# Patient Record
Sex: Female | Born: 1940
Health system: Southern US, Community
[De-identification: ages and names within clinical notes are randomized; demographics above are authoritative.]

## PROBLEM LIST (undated history)

## (undated) DIAGNOSIS — Z923 Personal history of irradiation: Secondary | ICD-10-CM

## (undated) DIAGNOSIS — E7800 Pure hypercholesterolemia, unspecified: Secondary | ICD-10-CM

## (undated) DIAGNOSIS — M8580 Other specified disorders of bone density and structure, unspecified site: Secondary | ICD-10-CM

## (undated) DIAGNOSIS — C541 Malignant neoplasm of endometrium: Secondary | ICD-10-CM

## (undated) DIAGNOSIS — C4491 Basal cell carcinoma of skin, unspecified: Secondary | ICD-10-CM

## (undated) HISTORY — DX: Basal cell carcinoma of skin, unspecified: C44.91

## (undated) HISTORY — DX: Personal history of irradiation: Z92.3

## (undated) HISTORY — PX: ABDOMINAL HYSTERECTOMY: SHX81

## (undated) HISTORY — DX: Malignant neoplasm of endometrium: C54.1

## (undated) HISTORY — PX: BASAL CELL CARCINOMA EXCISION: SHX1214

## (undated) HISTORY — DX: Other specified disorders of bone density and structure, unspecified site: M85.80

## (undated) HISTORY — DX: Pure hypercholesterolemia, unspecified: E78.00

## (undated) HISTORY — PX: CATARACT EXTRACTION: SUR2

## (undated) HISTORY — PX: DILATION AND CURETTAGE OF UTERUS: SHX78

---

## 1999-01-06 ENCOUNTER — Other Ambulatory Visit: Admission: RE | Admit: 1999-01-06 | Discharge: 1999-01-06 | Payer: Self-pay | Admitting: Family Medicine

## 1999-01-08 ENCOUNTER — Other Ambulatory Visit: Admission: RE | Admit: 1999-01-08 | Discharge: 1999-01-08 | Payer: Self-pay | Admitting: Gynecology

## 1999-02-06 ENCOUNTER — Encounter (INDEPENDENT_AMBULATORY_CARE_PROVIDER_SITE_OTHER): Payer: Self-pay | Admitting: Specialist

## 1999-02-06 ENCOUNTER — Ambulatory Visit (HOSPITAL_COMMUNITY): Admission: RE | Admit: 1999-02-06 | Discharge: 1999-02-06 | Payer: Self-pay | Admitting: Gynecology

## 2003-05-07 ENCOUNTER — Encounter (INDEPENDENT_AMBULATORY_CARE_PROVIDER_SITE_OTHER): Payer: Self-pay | Admitting: *Deleted

## 2003-05-07 ENCOUNTER — Ambulatory Visit (HOSPITAL_COMMUNITY): Admission: RE | Admit: 2003-05-07 | Discharge: 2003-05-07 | Payer: Self-pay | Admitting: *Deleted

## 2004-02-25 ENCOUNTER — Other Ambulatory Visit: Admission: RE | Admit: 2004-02-25 | Discharge: 2004-02-25 | Payer: Self-pay | Admitting: Family Medicine

## 2005-04-28 ENCOUNTER — Other Ambulatory Visit: Admission: RE | Admit: 2005-04-28 | Discharge: 2005-04-28 | Payer: Self-pay | Admitting: Family Medicine

## 2005-05-15 ENCOUNTER — Encounter: Admission: RE | Admit: 2005-05-15 | Discharge: 2005-08-02 | Payer: Self-pay | Admitting: Family Medicine

## 2006-04-30 ENCOUNTER — Other Ambulatory Visit: Admission: RE | Admit: 2006-04-30 | Discharge: 2006-04-30 | Payer: Self-pay | Admitting: Family Medicine

## 2006-05-18 ENCOUNTER — Encounter: Admission: RE | Admit: 2006-05-18 | Discharge: 2006-05-18 | Payer: Self-pay | Admitting: Family Medicine

## 2009-06-19 ENCOUNTER — Encounter: Admission: RE | Admit: 2009-06-19 | Discharge: 2009-06-19 | Payer: Self-pay | Admitting: Family Medicine

## 2009-08-03 DIAGNOSIS — C541 Malignant neoplasm of endometrium: Secondary | ICD-10-CM

## 2009-08-03 HISTORY — DX: Malignant neoplasm of endometrium: C54.1

## 2009-11-13 ENCOUNTER — Ambulatory Visit: Payer: Self-pay | Admitting: Gynecology

## 2009-11-15 ENCOUNTER — Ambulatory Visit: Payer: Self-pay | Admitting: Gynecology

## 2009-11-19 ENCOUNTER — Ambulatory Visit: Payer: Self-pay | Admitting: Gynecology

## 2009-11-21 ENCOUNTER — Ambulatory Visit
Admission: RE | Admit: 2009-11-21 | Discharge: 2009-11-21 | Payer: Self-pay | Source: Home / Self Care | Admitting: Gynecologic Oncology

## 2009-12-01 HISTORY — PX: OTHER SURGICAL HISTORY: SHX169

## 2009-12-03 ENCOUNTER — Inpatient Hospital Stay (HOSPITAL_COMMUNITY): Admission: RE | Admit: 2009-12-03 | Discharge: 2009-12-06 | Payer: Self-pay | Admitting: Obstetrics & Gynecology

## 2009-12-03 ENCOUNTER — Encounter: Payer: Self-pay | Admitting: Obstetrics & Gynecology

## 2009-12-03 HISTORY — PX: TOTAL ABDOMINAL HYSTERECTOMY W/ BILATERAL SALPINGOOPHORECTOMY: SHX83

## 2009-12-18 ENCOUNTER — Ambulatory Visit: Admission: RE | Admit: 2009-12-18 | Discharge: 2009-12-18 | Payer: Self-pay | Admitting: Gynecologic Oncology

## 2009-12-25 ENCOUNTER — Ambulatory Visit: Admission: RE | Admit: 2009-12-25 | Discharge: 2010-02-23 | Payer: Self-pay | Admitting: Radiation Oncology

## 2010-01-30 ENCOUNTER — Ambulatory Visit: Admission: RE | Admit: 2010-01-30 | Discharge: 2010-01-30 | Payer: Self-pay | Admitting: Gynecologic Oncology

## 2010-01-31 DIAGNOSIS — Z923 Personal history of irradiation: Secondary | ICD-10-CM

## 2010-01-31 HISTORY — DX: Personal history of irradiation: Z92.3

## 2010-05-22 ENCOUNTER — Ambulatory Visit: Admission: RE | Admit: 2010-05-22 | Discharge: 2010-05-22 | Payer: Self-pay | Admitting: Gynecologic Oncology

## 2010-08-20 ENCOUNTER — Other Ambulatory Visit: Payer: Self-pay | Admitting: Dermatology

## 2010-09-25 ENCOUNTER — Ambulatory Visit: Payer: Medicare Other | Attending: Radiation Oncology | Admitting: Radiation Oncology

## 2010-10-21 LAB — BASIC METABOLIC PANEL
CO2: 26 mEq/L (ref 19–32)
Calcium: 7.8 mg/dL — ABNORMAL LOW (ref 8.4–10.5)
Creatinine, Ser: 0.75 mg/dL (ref 0.4–1.2)
Glucose, Bld: 131 mg/dL — ABNORMAL HIGH (ref 70–99)
Sodium: 138 mEq/L (ref 135–145)

## 2010-10-21 LAB — TYPE AND SCREEN
ABO/RH(D): A POS
Antibody Screen: NEGATIVE

## 2010-10-21 LAB — CBC
Hemoglobin: 11.6 g/dL — ABNORMAL LOW (ref 12.0–15.0)
MCHC: 34.1 g/dL (ref 30.0–36.0)
MCHC: 33.8 g/dL (ref 30.0–36.0)
Platelets: 303 10*3/uL (ref 150–400)
RBC: 4.66 MIL/uL (ref 3.87–5.11)
RDW: 12.6 % (ref 11.5–15.5)

## 2010-10-21 LAB — DIFFERENTIAL
Basophils Absolute: 0 10*3/uL (ref 0.0–0.1)
Basophils Relative: 0 % (ref 0–1)
Monocytes Relative: 5 % (ref 3–12)
Neutro Abs: 7.4 10*3/uL (ref 1.7–7.7)
Neutrophils Relative %: 76 % (ref 43–77)

## 2010-10-21 LAB — COMPREHENSIVE METABOLIC PANEL
ALT: 17 U/L (ref 0–35)
AST: 18 U/L (ref 0–37)
Albumin: 3.8 g/dL (ref 3.5–5.2)
Alkaline Phosphatase: 55 U/L (ref 39–117)
GFR calc Af Amer: >60 mL/min (ref >60)
Glucose, Bld: 112 mg/dL — ABNORMAL HIGH (ref 70–99)
Potassium: 4.4 mEq/L (ref 3.5–5.1)
Sodium: 139 mEq/L (ref 135–145)
Total Protein: 6.8 g/dL (ref 6.0–8.3)

## 2010-10-21 LAB — ABO/RH: ABO/RH(D): A POS

## 2010-12-15 ENCOUNTER — Other Ambulatory Visit (HOSPITAL_COMMUNITY)
Admission: RE | Admit: 2010-12-15 | Discharge: 2010-12-15 | Disposition: A | Discharge status: Home / Self Care | Payer: Medicare Other | Source: Ambulatory Visit | Attending: Gynecology | Admitting: Gynecology

## 2010-12-15 ENCOUNTER — Encounter (INDEPENDENT_AMBULATORY_CARE_PROVIDER_SITE_OTHER): Payer: Medicare Other | Admitting: Gynecology

## 2010-12-15 ENCOUNTER — Other Ambulatory Visit: Payer: Self-pay | Admitting: Gynecology

## 2010-12-15 DIAGNOSIS — Z124 Encounter for screening for malignant neoplasm of cervix: Secondary | ICD-10-CM

## 2010-12-15 DIAGNOSIS — N951 Menopausal and female climacteric states: Secondary | ICD-10-CM

## 2010-12-15 DIAGNOSIS — C549 Malignant neoplasm of corpus uteri, unspecified: Secondary | ICD-10-CM

## 2010-12-15 DIAGNOSIS — N952 Postmenopausal atrophic vaginitis: Secondary | ICD-10-CM

## 2011-03-19 ENCOUNTER — Ambulatory Visit: Payer: Medicare Other | Admitting: Gynecologic Oncology

## 2011-03-19 ENCOUNTER — Ambulatory Visit: Payer: Medicare Other | Attending: Gynecologic Oncology | Admitting: Gynecologic Oncology

## 2011-03-19 DIAGNOSIS — Z9079 Acquired absence of other genital organ(s): Secondary | ICD-10-CM | POA: Insufficient documentation

## 2011-03-19 DIAGNOSIS — E785 Hyperlipidemia, unspecified: Secondary | ICD-10-CM | POA: Insufficient documentation

## 2011-03-19 DIAGNOSIS — C549 Malignant neoplasm of corpus uteri, unspecified: Secondary | ICD-10-CM | POA: Insufficient documentation

## 2011-03-19 DIAGNOSIS — M810 Age-related osteoporosis without current pathological fracture: Secondary | ICD-10-CM | POA: Insufficient documentation

## 2011-03-19 DIAGNOSIS — I10 Essential (primary) hypertension: Secondary | ICD-10-CM | POA: Insufficient documentation

## 2011-03-24 NOTE — Consult Note (Signed)
NAMEGUILIANNA, Wyatt NO.:  [id number]  MEDICAL RECORD NO.:  [id number]  LOCATION:  GYN                          FACILITY:  [facility]  PHYSICIAN:  Laurette Schimke, MD     DATE OF BIRTH:  Aug 05, 1940  DATE OF CONSULTATION: DATE OF DISCHARGE:                                CONSULTATION   REASON FOR VISIT:  Surveillance for stage IB grade 2 endometrioid adenocarcinoma of the uterus.  HISTORY OF PRESENT ILLNESS:  70 year old diagnosed with a grade 2 endometrioid adenocarcinoma in early 2011.  On Dec 03, 2009, she underwent a total abdominal hysterectomy, bilateral salpingo- oophorectomy, bilateral pelvic and periaortic lymph node sampling. Pathology was notable for an invasive endometrioid adenocarcinoma grade 2, invading into outer half of the endometrium.  She was a stage IB grade 2.  She underwent adjuvant high-dose rate brachytherapy to a total dose of 50 Gy in 5 fractions, which was completed on February 27, 2010.  PAST MEDICAL HISTORY: 1. Hyperlipidemia. 2. Hypertension. 3. Osteoporosis. 4. Stage IB grade 2 endometrial cancer.  PAST SURGICAL HISTORY: 1. Total abdominal hysterectomy. 2. Bilateral salpingo-oophorectomy. 3. Bilateral pelvic and periaortic lymph node dissection in May 2011. 4. Bilateral blepharoplasty in May 2011.  FAMILY HISTORY:  Two premenopausal daughters with a diagnosis of breast cancer and a half-sister diagnosed with breast cancer.  The patient and her daughters have declined genetic evaluation.  SCREENING HISTORY:  Colonoscopy in 2009, within normal limits.  Breast evaluation this year.  SOCIAL HISTORY:  Victoria Wyatt is widowed, lives independently, has a history of tobacco  use, but no current use.  Has daughters who were very supportive.  REVIEW OF SYSTEMS:  No headache, shortness of breath, chest pain, adenopathy, bloating.  No constipation.  History of diarrhea a week ago that resolved.  Denies lower or upper extremity edema.  No  change in appetite, early satiety.  No vaginal bleeding.  No hematuria or hematochezia.  Otherwise, 10-point review of systems is negative.  PHYSICAL EXAMINATION:  GENERAL:  Well-developed thin female.  No acute distress. VITAL SIGNS:  Weight 127 pounds, blood pressure 132/72, temperature 97.8. CHEST:  Clear to auscultation. HEART:  Regular rate and rhythm. ABDOMEN:  Soft, nontender, nondistended.  No palpable masses. BACK:  No CVA tenderness. EXTREMITIES:  No clubbing, cyanosis, or edema. PELVIC:  Normal external genitalia, Bartholin, urethral and Skene. Atrophic appearing vagina without any discharge.  No masses palpable in the cul-de-sac, pelvis, or rectovaginal septum. RECTAL:  Good anal sphincter tone without any masses.  IMPRESSION:  Victoria Wyatt has no evidence of disease from a stage IB grade 2 endometrioid adenocarcinoma, treated with staging and adjuvant brachytherapy.  The followup plan is that she will see GYN-Oncology in November.  She will see Dr. Lurline Hare of Radiation Oncology in January of 2013, as has been on radiation scheduled and to follow up with Dr. Colin Broach in May 2013.     Laurette Schimke, MD     WB/MEDQ  D:  03/19/2011  T:  03/19/2011  Job:  [id number]  cc:   Marcial Pacas P. Fontaine, M.D. Fax: [phone]  Lurline Hare, M.D. Fax: [phone]  Telford Nab, R.N. [address] [place], [place] [address]  Anna Genre. Little, M.D. Fax: [phone]  Electronically Signed by Laurette Schimke MD on 03/24/2011 07:03:47 AM

## 2011-03-25 ENCOUNTER — Other Ambulatory Visit: Payer: Self-pay | Admitting: Dermatology

## 2011-06-10 NOTE — Progress Notes (Signed)
Consult Note: Gyn-Onc  Victoria Wyatt 70 y.o. female  CC:  Chief Complaint  Patient presents with  . Follow-up    Endo ca    HPI:   70 year old diagnosed with a grade 2 endometrioid adenocarcinoma in early 2011.  On Dec 03, 2009, she underwent a total abdominal hysterectomy, bilateral salpingo-oophorectomy, bilateral pelvic and periaortic lymph node sampling. Pathology was notable for an invasive endometrioid adenocarcinoma grade 2, invading into outer half of the endometrium.  She was a stage IB grade 2.  She underwent adjuvant high-dose rate brachytherapy to a total dose of 50 Gy in 5 fractions, which was completed on February 27, 2010.    Interval History: She denies no interval complaints. A Pap test was collected by Dr. Audie Box and was within normal limits.  Performance status 0  Review of Systems  Constitutional  Feels well no complaints Skin/Breast  No rash, sores, jaundice, itching, dryness, lumps, swelling, breast pain, or nipple discharge. Age spots. Continues to require removal of multiple precancerous lesion.  Cardiovascular  No chest pain, shortness of breath, or edema  Pulmonary  No cough or wheeze.  Gastro Intestinal  No nausea, vomitting, or diarrhoea. No bright red blood per rectum, no abdominal pain, change in bowel movement, or constipation.  Genito Urinary  No frequency, urgency, dysuria, no vaginal bleeding continues to use her dilator 3 times per week. Musculo Skeletal  No myalgia, arthralgia, joint swelling or pain  Neurologic  No weakness, numbness, change in gait,  Psychology  No depression, anxiety, insomnia.    Current Meds:  Outpatient Encounter Prescriptions as of 07/02/2011  Medication Sig Dispense Refill  . aspirin 81 MG chewable tablet Chew 81 mg by mouth daily.        . calcium-vitamin D (OSCAL WITH D) 500-200 MG-UNIT per tablet Take 1 tablet by mouth. Take 2 daily       . hydrochlorothiazide (,MICROZIDE/HYDRODIURIL,) 12.5 MG capsule Take  12.5 mg by mouth daily.        . Multiple Vitamins-Minerals (CENTRUM SILVER PO) Take by mouth daily.        . pravastatin (PRAVACHOL) 20 MG tablet Take 20 mg by mouth daily.          Allergy:  Allergies  Allergen Reactions  . Lidocaine     Causes facial swelling and rednes    Social Hx:   History   Social History  . Marital Status: Widowed    Spouse Name: N/A    Number of Children: N/A  . Years of Education: N/A   Occupational History  . Not on file.   Social History Main Topics  . Smoking status: Former Games developer  . Smokeless tobacco: Not on file  . Alcohol Use: 3.0 oz/week    5 Glasses of wine per week  . Drug Use: No  . Sexually Active: No   Other Topics Concern  . Not on file   Social History Narrative  . No narrative on file    Past Surgical Hx:  Past Surgical History  Procedure Date  . Total abdominal hysterectomy w/ bilateral salpingoophorectomy 2011  . Cataract extraction     bi-lat  . Dilation and curettage of uterus   . Bilateral blepharoplasty May 2011    Past Medical Hx:  Past Medical History  Diagnosis Date  . Hypercholesteremia   . Hypertension   . Bone loss   . Cancer   . Malignant neoplasm of body of uterus 07/02/2011    Family Hx:  Family History  Problem Relation Age of Onset  . Cancer Sister     breast  . Breast cancer Daughter   . Cancer Daughter   . Emphysema Mother   . Hypertension Father   . Emphysema Father     Vitals:  Blood pressure 132/70, pulse 64, temperature 98.1 F (36.7 C), temperature source Oral, resp. rate 16, height 5' 4.37" (1.635 m), weight 127 lb 14.4 oz (58.015 kg).  Physical Exam: Neck  Supple without any enlargements.  Cardiovascular  Pulse normal rate, regularity and rhythm. S1 and S2 normal, without any murmur, rub, or gallop.  Lungs  Clear to auscultation bilateraly, without wheezes/crackles/rhonchi. Good air movement.  Skin  No rash/lesions/breakdown  Psychiatry  Alert and oriented to  person, place, and time  Abdomen  Normoactive bowel sounds, abdomen soft, non-tender and obese. Surgical  sites intact without evidence of hernia. Hyperactive bowel sounds no masses no ascites identified. Genito Urinary  Vulva/vagina: Normal external female genitalia.  No lesions. No discharge or bleeding. Vagina is atrophic without any palpable masses  Bladder/urethra:  No lesions or masses  No cul-de-sac masses  Rectal  Good tone, no masses no cul de sac nodularity.  Extremities  No bilateral cyanosis, clubbing or edema. No rash, lesions or petiche.    Assessment/Plan: Victoria Wyatt has no evidence of disease at this visit from her stage IB grade 2 endometrioid adenocarcinoma she was counseled regarding signs and symptoms of recurrent disease. Victoria Wyatt has been advised to followup with Dr. Lurline Hare in March of 2013. Followup with Dr. Audie Box and annual Pap evaluation in July of 2013. She'll follow up with the GYN oncology service in July of 2013.   Laurette Schimke, MD 07/02/2011, 9:16 AM

## 2011-06-19 ENCOUNTER — Other Ambulatory Visit: Payer: Self-pay | Admitting: Dermatology

## 2011-07-01 ENCOUNTER — Encounter: Payer: Self-pay | Admitting: *Deleted

## 2011-07-02 ENCOUNTER — Ambulatory Visit: Payer: Medicare Other | Attending: Gynecologic Oncology | Admitting: Gynecologic Oncology

## 2011-07-02 ENCOUNTER — Encounter: Payer: Self-pay | Admitting: Gynecologic Oncology

## 2011-07-02 DIAGNOSIS — Z7982 Long term (current) use of aspirin: Secondary | ICD-10-CM | POA: Insufficient documentation

## 2011-07-02 DIAGNOSIS — Z79899 Other long term (current) drug therapy: Secondary | ICD-10-CM | POA: Insufficient documentation

## 2011-07-02 DIAGNOSIS — I10 Essential (primary) hypertension: Secondary | ICD-10-CM | POA: Insufficient documentation

## 2011-07-02 DIAGNOSIS — C549 Malignant neoplasm of corpus uteri, unspecified: Secondary | ICD-10-CM | POA: Insufficient documentation

## 2011-07-02 DIAGNOSIS — E7800 Pure hypercholesterolemia, unspecified: Secondary | ICD-10-CM | POA: Insufficient documentation

## 2011-07-02 NOTE — Patient Instructions (Addendum)
Assessment/Plan:  No evidence of disease at this visit from her stage IB grade 2 endometrioid adenocarcinoma.  Advised to followup with Dr. Lurline Hare in March of 2013. Followup with Dr. Audie Box and annual Pap evaluation in July of 2013. She'll follow up with the GYN oncology service in July of 2013.

## 2011-07-06 ENCOUNTER — Encounter: Payer: Self-pay | Admitting: Gynecology

## 2011-08-26 ENCOUNTER — Other Ambulatory Visit: Payer: Self-pay | Admitting: Dermatology

## 2011-08-26 DIAGNOSIS — C44519 Basal cell carcinoma of skin of other part of trunk: Secondary | ICD-10-CM | POA: Diagnosis not present

## 2011-08-26 DIAGNOSIS — C44711 Basal cell carcinoma of skin of unspecified lower limb, including hip: Secondary | ICD-10-CM | POA: Diagnosis not present

## 2011-08-26 DIAGNOSIS — L821 Other seborrheic keratosis: Secondary | ICD-10-CM | POA: Diagnosis not present

## 2011-08-26 DIAGNOSIS — D239 Other benign neoplasm of skin, unspecified: Secondary | ICD-10-CM | POA: Diagnosis not present

## 2011-08-27 ENCOUNTER — Ambulatory Visit: Payer: Medicare Other | Admitting: Radiation Oncology

## 2011-09-30 DIAGNOSIS — R7301 Impaired fasting glucose: Secondary | ICD-10-CM | POA: Diagnosis not present

## 2011-10-15 ENCOUNTER — Encounter: Payer: Self-pay | Admitting: Radiation Oncology

## 2011-10-15 ENCOUNTER — Ambulatory Visit
Admission: RE | Admit: 2011-10-15 | Discharge: 2011-10-15 | Disposition: A | Discharge status: Home / Self Care | Payer: Medicare Other | Source: Ambulatory Visit | Attending: Radiation Oncology | Admitting: Radiation Oncology

## 2011-10-15 NOTE — Progress Notes (Signed)
CC:   Victoria Schimke, MD  DIAGNOSIS:  Stage IB, grade 2 endometrioid adenocarcinoma of the uterus.  PREVIOUS RADIATION:  HDR intracavitary brachytherapy to a total dose of 30 Gy in 5 fractions completed 01/2010.  INTERVAL SINCE TREATMENT:  1 year and half.  INTERVAL HISTORY:  Victoria Wyatt reports for followup today.  She has unfortunately had a recurrence of her diarrhea and is taking probiotics for that.  She has struggled with that since taking Cipro after eye surgery.  She had a negative Pap smear in May.  She is seeing Dr. Nelly Rout in October.  She is using her dilator regularly.  PHYSICAL EXAMINATION:  General:  She is a pleasant female in no distress, sitting comfortably on the exam room table.  Vital signs: Weight 128 pounds.  Blood pressure 161/69, pulse 66, temperature 97. She has no palpable supraclavicular adenopathy.  She is alert and orient x3.  Examination of the vulva and introitus show no mucosal lesions. Her vaginal cuff is smooth with postoperative changes.  There is no evidence of recurrence at the vaginal cuff.  IMPRESSION:  Stage IB, grade 2 endometrial cancer with no evidence of disease.  RECOMMENDATIONS:  Victoria Wyatt looks great.  She will continue followup with myself, Dr. Nelly Rout and her primary care physician.  She will get her normal Pap smear this summer.  I have encouraged her to contact me with any questions or concerns.    ______________________________ Lurline Hare, M.D. SW/MEDQ  D:  10/15/2011  T:  10/15/2011  Job:  [id number]

## 2011-10-15 NOTE — Progress Notes (Signed)
HERE TODAY FOR FU UTERINE CANCER .  IS TAKING PROBIOTIC FOR DIARRHEA THAT COMES AND GOES, OK'D BY HER PCP.  NO OTHER C/O

## 2011-11-03 DIAGNOSIS — M810 Age-related osteoporosis without current pathological fracture: Secondary | ICD-10-CM | POA: Diagnosis not present

## 2011-11-03 DIAGNOSIS — R7301 Impaired fasting glucose: Secondary | ICD-10-CM | POA: Diagnosis not present

## 2011-11-03 DIAGNOSIS — I10 Essential (primary) hypertension: Secondary | ICD-10-CM | POA: Diagnosis not present

## 2011-11-03 DIAGNOSIS — E7800 Pure hypercholesterolemia, unspecified: Secondary | ICD-10-CM | POA: Diagnosis not present

## 2011-11-03 DIAGNOSIS — M25569 Pain in unspecified knee: Secondary | ICD-10-CM | POA: Diagnosis not present

## 2011-11-17 DIAGNOSIS — M7120 Synovial cyst of popliteal space [Baker], unspecified knee: Secondary | ICD-10-CM | POA: Diagnosis not present

## 2011-12-17 DIAGNOSIS — R319 Hematuria, unspecified: Secondary | ICD-10-CM | POA: Diagnosis not present

## 2011-12-17 DIAGNOSIS — N390 Urinary tract infection, site not specified: Secondary | ICD-10-CM | POA: Diagnosis not present

## 2011-12-25 DIAGNOSIS — L723 Sebaceous cyst: Secondary | ICD-10-CM | POA: Diagnosis not present

## 2011-12-25 DIAGNOSIS — Z85828 Personal history of other malignant neoplasm of skin: Secondary | ICD-10-CM | POA: Diagnosis not present

## 2011-12-25 DIAGNOSIS — I6789 Other cerebrovascular disease: Secondary | ICD-10-CM | POA: Diagnosis not present

## 2012-01-08 ENCOUNTER — Encounter: Payer: Self-pay | Admitting: Gynecology

## 2012-01-08 ENCOUNTER — Other Ambulatory Visit (HOSPITAL_COMMUNITY)
Admission: RE | Admit: 2012-01-08 | Discharge: 2012-01-08 | Disposition: A | Discharge status: Home / Self Care | Payer: Medicare Other | Source: Ambulatory Visit | Attending: Gynecology | Admitting: Gynecology

## 2012-01-08 ENCOUNTER — Ambulatory Visit (INDEPENDENT_AMBULATORY_CARE_PROVIDER_SITE_OTHER): Payer: Medicare Other | Admitting: Gynecology

## 2012-01-08 VITALS — BP 116/72 | Ht 65.0 in | Wt 128.0 lb

## 2012-01-08 DIAGNOSIS — N952 Postmenopausal atrophic vaginitis: Secondary | ICD-10-CM | POA: Diagnosis not present

## 2012-01-08 DIAGNOSIS — Z9189 Other specified personal risk factors, not elsewhere classified: Secondary | ICD-10-CM | POA: Diagnosis not present

## 2012-01-08 DIAGNOSIS — Z124 Encounter for screening for malignant neoplasm of cervix: Secondary | ICD-10-CM | POA: Insufficient documentation

## 2012-01-08 DIAGNOSIS — C4491 Basal cell carcinoma of skin, unspecified: Secondary | ICD-10-CM | POA: Insufficient documentation

## 2012-01-08 DIAGNOSIS — R319 Hematuria, unspecified: Secondary | ICD-10-CM

## 2012-01-08 DIAGNOSIS — C55 Malignant neoplasm of uterus, part unspecified: Secondary | ICD-10-CM | POA: Diagnosis not present

## 2012-01-08 LAB — URINALYSIS W MICROSCOPIC + REFLEX CULTURE
Bilirubin Urine: NEGATIVE
Glucose, UA: NEGATIVE mg/dL
Hgb urine dipstick: NEGATIVE
Protein, ur: NEGATIVE mg/dL
pH: 6 (ref 5.0–8.0)

## 2012-01-08 NOTE — Progress Notes (Signed)
Victoria Wyatt [birth year] [id number]        71 y.o.  for follow up exam. Several issues noted below.  Past medical history,surgical history, medications, allergies, family history and social history were all reviewed and documented in the EPIC chart. ROS:  Was performed and pertinent positives and negatives are included in the history.  Exam: Victoria Wyatt chaperone present Filed Vitals:   01/08/12 0912  BP: 116/72   General appearance  Normal Skin multiple benign-appearing seborrheic keratoses covering chest and back Head/Neck normal with no cervical or supraclavicular adenopathy thyroid normal Lungs  clear Cardiac RR, without RMG Abdominal  soft, nontender, without masses, organomegaly or hernia Breasts  examined lying and sitting without masses, retractions, discharge or axillary adenopathy. Pelvic  Ext/BUS/vagina  Atrophic changes. Pap of cuff done  Adnexa  Without masses or tenderness    Anus and perineum  normal   Rectovaginal  normal sphincter tone without palpated masses or tenderness.    Assessment/Plan:  71 y.o. female for follow up.    1. Hematuria. Patient has a history of single episode of hematuria. She saw her primary physician 12/17/2011 who did a urinalysis which showed large leukocyte esterase large blood on dipstick, microscopic showed occasional rbc's too numerous to count WBCs. She was treated as a UTI and has never had another episode of hematuria. She was getting a little bit of irritation before this which has resolved.  She's had no recurrence of this. She is status post TAH/BSO radiation treatment for stage IB grade 2 endometrial carcinoma 2011.  Exam today is normal except for atrophic changes. Urinalysis today is negative without evidence of blood or any other or abnormality. Reviewed with the patient that I think this is probably related to infection and that we could monitor, if she had any recurrent episodes to refer her to urology, but given her history of endometrial  carcinoma I'm going to check with Dr. Laurette Wyatt who is her gynecologic oncologist for her opinion as to whether to send her now or monitor. 2. Stage IB, grade 2 endometrial carcinoma. Exam is normal. Pap of cuff done. We'll follow up with Dr. Nelly Wyatt in 6 months as we are alternating exams. 3. Mammography. Patient due for mammogram next week and will follow up for this. She'll continue with annual mammography. SBE monthly reviewed. 4. Osteopenia. Patient has osteopenia on DEXA. She's due for repeat November 2013 and noticed follow up for this. She's been followed by Dr. Clarene Wyatt for this. 5. Colonoscopy. Patient had her colonoscopy in 2009, repeated the recommended interval. 6. Health maintenance. Her blood work was done today as is all done through Dr. Fredirick Wyatt office.     Victoria Lords MD, 10:04 AM 01/08/2012

## 2012-01-08 NOTE — Patient Instructions (Signed)
Office will contact you with Dr. Forrestine Him recommendation. Follow up in 6 months with Dr. Nelly Rout for follow up

## 2012-01-13 ENCOUNTER — Telehealth: Payer: Self-pay | Admitting: Gynecology

## 2012-01-13 NOTE — Telephone Encounter (Signed)
Tell patient that Dr. Nelly Rout answered my e-mail and that she felt we could watch for now but if she has a recurrence of hematuria then she would recommend urology evaluation.

## 2012-01-13 NOTE — Telephone Encounter (Signed)
Pt informed with the below note. 

## 2012-01-13 NOTE — Telephone Encounter (Signed)
Left message for pt to call.

## 2012-04-15 DIAGNOSIS — M810 Age-related osteoporosis without current pathological fracture: Secondary | ICD-10-CM | POA: Diagnosis not present

## 2012-04-15 DIAGNOSIS — R319 Hematuria, unspecified: Secondary | ICD-10-CM | POA: Diagnosis not present

## 2012-04-15 DIAGNOSIS — N390 Urinary tract infection, site not specified: Secondary | ICD-10-CM | POA: Diagnosis not present

## 2012-04-15 DIAGNOSIS — R7301 Impaired fasting glucose: Secondary | ICD-10-CM | POA: Diagnosis not present

## 2012-04-15 DIAGNOSIS — M25569 Pain in unspecified knee: Secondary | ICD-10-CM | POA: Diagnosis not present

## 2012-04-15 DIAGNOSIS — Z23 Encounter for immunization: Secondary | ICD-10-CM | POA: Diagnosis not present

## 2012-04-15 DIAGNOSIS — I10 Essential (primary) hypertension: Secondary | ICD-10-CM | POA: Diagnosis not present

## 2012-04-15 DIAGNOSIS — E7800 Pure hypercholesterolemia, unspecified: Secondary | ICD-10-CM | POA: Diagnosis not present

## 2012-06-09 ENCOUNTER — Encounter: Payer: Self-pay | Admitting: Gynecologic Oncology

## 2012-06-09 ENCOUNTER — Ambulatory Visit: Payer: Medicare Other | Attending: Gynecologic Oncology | Admitting: Gynecologic Oncology

## 2012-06-09 VITALS — BP 162/70 | HR 72 | Temp 98.5°F | Resp 16 | Ht 64.37 in | Wt 130.2 lb

## 2012-06-09 DIAGNOSIS — Z90710 Acquired absence of both cervix and uterus: Secondary | ICD-10-CM | POA: Insufficient documentation

## 2012-06-09 DIAGNOSIS — Z9079 Acquired absence of other genital organ(s): Secondary | ICD-10-CM | POA: Insufficient documentation

## 2012-06-09 DIAGNOSIS — C549 Malignant neoplasm of corpus uteri, unspecified: Secondary | ICD-10-CM | POA: Diagnosis not present

## 2012-06-09 DIAGNOSIS — I10 Essential (primary) hypertension: Secondary | ICD-10-CM | POA: Diagnosis not present

## 2012-06-09 DIAGNOSIS — Z8542 Personal history of malignant neoplasm of other parts of uterus: Secondary | ICD-10-CM | POA: Insufficient documentation

## 2012-06-09 DIAGNOSIS — Z9221 Personal history of antineoplastic chemotherapy: Secondary | ICD-10-CM | POA: Insufficient documentation

## 2012-06-09 DIAGNOSIS — Z85828 Personal history of other malignant neoplasm of skin: Secondary | ICD-10-CM | POA: Insufficient documentation

## 2012-06-09 DIAGNOSIS — Z803 Family history of malignant neoplasm of breast: Secondary | ICD-10-CM | POA: Diagnosis not present

## 2012-06-09 DIAGNOSIS — Z809 Family history of malignant neoplasm, unspecified: Secondary | ICD-10-CM | POA: Insufficient documentation

## 2012-06-09 DIAGNOSIS — Z8249 Family history of ischemic heart disease and other diseases of the circulatory system: Secondary | ICD-10-CM | POA: Insufficient documentation

## 2012-06-09 DIAGNOSIS — N390 Urinary tract infection, site not specified: Secondary | ICD-10-CM | POA: Insufficient documentation

## 2012-06-09 DIAGNOSIS — C541 Malignant neoplasm of endometrium: Secondary | ICD-10-CM

## 2012-06-09 NOTE — Patient Instructions (Signed)
If there any further episodes of hematuria that are not associated with concurrent  urinary tract infection and evaluation by urology would be prudent given the recent history of radiotherapy. No evidence of disease since completion of treatment on 02/27/2010  Followup with Dr. Audie Box in 6 months  Followup with GYN oncology in 6 months   Thank you very much Ms. [name] for allowing me to provide care for you today.  I appreciate your confidence in choosing our Gynecologic Oncology team.  If you have any questions about your visit today please call our office and we will get back to you as soon as possible.  Maryclare Labrador. Maxey Ransom MD., PhD Gynecologic Oncology

## 2012-06-09 NOTE — Progress Notes (Signed)
Consult Note: Gyn-Onc  Victoria Wyatt 71 y.o. female  CC:  Chief Complaint  Patient presents with  . Endo ca    Follow up    HPI:   71 year old diagnosed with a grade 2 endometrioid adenocarcinoma in early 2011.  On Dec 03, 2009, she underwent a total abdominal hysterectomy, bilateral salpingo-oophorectomy, bilateral pelvic and periaortic lymph node sampling. Pathology was notable for an invasive endometrioid adenocarcinoma grade 2, invading into outer half of the endometrium.  She was a stage IB grade 2.  She underwent adjuvant high-dose rate brachytherapy to a total dose of 50 Gy in 5 fractions, which was completed on February 27, 2010.    Interval History:   In May 2013 hematuria was noted it lasted approximately 3 days, self limiting. Followup urinalysis was negative for hematuria. Patient denies any further occurrences. She denies nausea vomiting, changes in weight early satiety back pain headache cough bleeding from the rectum or the vagina.  Performance status 0  Review of Systems  Constitutional  Feels well no complaints Cardiovascular  No chest pain, shortness of breath, or edema  Pulmonary  No cough or wheeze. Reports sinus drip Gastro Intestinal  No nausea, vomitting, or diarrhoea. No bright red blood per rectum, no abdominal pain, change in bowel movement, or constipation.  Genito Urinary  No frequency, urgency, dysuria, no vaginal bleeding. Pap test this year was within normal limits Musculo Skeletal  No myalgia, joint swelling or pain .  Reports significant arthritis. Neurologic  No weakness, numbness, change in gait,  Psychology  No depression, anxiety, insomnia.   Social Hx:  No interval changes.  Very happy.  Past Surgical Hx:  Past Surgical History  Procedure Date  . Total abdominal hysterectomy w/ bilateral salpingoophorectomy 2011  . Cataract extraction     bi-lat  . Dilation and curettage of uterus   . Bilateral blepharoplasty May 2011    Past  Medical Hx:  Past Medical History  Diagnosis Date  . Hypercholesteremia   . Hypertension   . Fibroid   . Cancer   . Malignant neoplasm of body of uterus 07/02/2011    Stage IB, grade 2  . Basal cell cancer   . Osteopenia 06/2010    T score -1.5    Family Hx:  Family History  Problem Relation Age of Onset  . Breast cancer Sister   . Heart disease Sister   . Breast cancer Daughter   . Cancer Daughter   . Emphysema Mother   . Hypertension Father   . Emphysema Father   . Breast cancer Daughter     Vitals:  Blood pressure 162/70, pulse 72, temperature 98.5 F (36.9 C), temperature source Oral, resp. rate 16, height 5' 4.37" (1.635 m), weight 130 lb 3.2 oz (59.058 kg).  Physical Exam: Neck  Supple without any enlargements.  Cardiovascular  Pulse normal rate, regularity and rhythm. S1 and S2 normal, without any murmur, rub, or gallop.  Lungs  Clear to auscultation bilateraly, without wheezes/crackles/rhonchi. Good air movement.  Psychiatry  Alert and oriented to person, place, and time  Abdomen  Normoactive bowel sounds, abdomen soft, non-tender.  Midline surgical incision notable for approximately 1.5 cm hernia at the superior aspect of the incision. Normal bowel sounds no masses no ascites identified. Genito Urinary  Vulva/vagina: Normal external female genitalia.  No lesions. No discharge or bleeding. Vagina is atrophic without any palpable masses  Bladder/urethra:  No lesions or masses  No cul-de-sac masses  Rectal  Good  tone, no masses no cul de sac nodularity.  Extremities  No bilateral cyanosis, clubbing or edema. No rash, lesions or petiche.    Assessment/Plan: Ms. Victoria Wyatt has no evidence of disease at this visit from her stage IB grade 2 endometrioid adenocarcinoma Ms. Victoria Wyatt has been advised that if there any further episodes of hematuria that are not associated with concurrent  Urinary tract infection and evaluation by urology would be prudent given the recent  history of radiotherapy. No evidence of disease since completion of treatment on 02/27/2010  Followup with Dr. Audie Box in 6 months  Followup with GYN oncology in 6 months  Laurette Schimke, MD 06/09/2012, 8:32 AM

## 2012-07-05 ENCOUNTER — Encounter: Payer: Self-pay | Admitting: Gynecology

## 2012-07-05 DIAGNOSIS — Z1231 Encounter for screening mammogram for malignant neoplasm of breast: Secondary | ICD-10-CM | POA: Diagnosis not present

## 2012-07-05 DIAGNOSIS — M899 Disorder of bone, unspecified: Secondary | ICD-10-CM | POA: Diagnosis not present

## 2012-07-05 DIAGNOSIS — M949 Disorder of cartilage, unspecified: Secondary | ICD-10-CM | POA: Diagnosis not present

## 2012-07-06 ENCOUNTER — Encounter: Payer: Self-pay | Admitting: Gynecology

## 2012-08-26 ENCOUNTER — Other Ambulatory Visit: Payer: Self-pay | Admitting: Dermatology

## 2012-08-26 DIAGNOSIS — D485 Neoplasm of uncertain behavior of skin: Secondary | ICD-10-CM | POA: Diagnosis not present

## 2012-08-26 DIAGNOSIS — D239 Other benign neoplasm of skin, unspecified: Secondary | ICD-10-CM | POA: Diagnosis not present

## 2012-08-26 DIAGNOSIS — D1739 Benign lipomatous neoplasm of skin and subcutaneous tissue of other sites: Secondary | ICD-10-CM | POA: Diagnosis not present

## 2012-08-26 DIAGNOSIS — L820 Inflamed seborrheic keratosis: Secondary | ICD-10-CM | POA: Diagnosis not present

## 2012-08-26 DIAGNOSIS — D1801 Hemangioma of skin and subcutaneous tissue: Secondary | ICD-10-CM | POA: Diagnosis not present

## 2012-08-26 DIAGNOSIS — L821 Other seborrheic keratosis: Secondary | ICD-10-CM | POA: Diagnosis not present

## 2012-10-13 ENCOUNTER — Ambulatory Visit: Payer: Medicare Other | Admitting: Radiation Oncology

## 2012-10-14 ENCOUNTER — Ambulatory Visit: Payer: Medicare Other | Admitting: Radiation Oncology

## 2012-10-19 ENCOUNTER — Encounter: Payer: Self-pay | Admitting: Radiation Oncology

## 2012-10-19 DIAGNOSIS — C541 Malignant neoplasm of endometrium: Secondary | ICD-10-CM | POA: Insufficient documentation

## 2012-10-19 DIAGNOSIS — Z923 Personal history of irradiation: Secondary | ICD-10-CM

## 2012-10-21 ENCOUNTER — Ambulatory Visit
Admission: RE | Admit: 2012-10-21 | Discharge: 2012-10-21 | Disposition: A | Discharge status: Home / Self Care | Payer: Medicare Other | Source: Ambulatory Visit | Attending: Radiation Oncology | Admitting: Radiation Oncology

## 2012-10-21 ENCOUNTER — Encounter: Payer: Self-pay | Admitting: Radiation Oncology

## 2012-10-21 VITALS — BP 194/75 | HR 77 | Temp 98.2°F | Resp 20 | Wt 133.4 lb

## 2012-10-21 DIAGNOSIS — C541 Malignant neoplasm of endometrium: Secondary | ICD-10-CM

## 2012-10-21 NOTE — Progress Notes (Signed)
Pt denies pain, fatigue, loss of appetite, nausea, diarrhea, vaginal discharge. She is using dilator 3 days a week. Last pap smear June 2013.

## 2012-10-21 NOTE — Progress Notes (Signed)
   Department of Radiation Oncology  Phone:  [phone] Fax:        [phone]   Name: Victoria Wyatt MRN: [id number]  DOB: 1941-02-04  Date: 10/21/2012  Follow Up Visit Note  Diagnosis: Stage IB grade 2 endometrioid adenocarcinoma of the uterus  Summary and Interval since last radiation: HDR intracavitary brachytherapy to a total dose of 30 gray in 5 fractions completed 7 2011  Interval History: Hadlei presents today for routine followup.  She is almost 3 years out from treatment. She had an episode of hematuria. This occurred during the summer and was worked up by Dr. Nelly Rout and her GYN. She has no pelvic related complaints. She is using her dilator 3 times a week. She is traveling back and forth to the beach. She's not noticed any uterine bleeding. No abdominal pain or tenderness. She had a negative Pap smear this summer.  Allergies:  Allergies  Allergen Reactions  . Lidocaine     Causes facial swelling and redness, Lidocaine was mixed w/Epinephrine per pt    Medications:  Current Outpatient Prescriptions  Medication Sig Dispense Refill  . aspirin 81 MG chewable tablet Chew 81 mg by mouth daily.        . calcium-vitamin D (OSCAL WITH D) 500-200 MG-UNIT per tablet Take 1 tablet by mouth. Take 2 daily       . hydrochlorothiazide (,MICROZIDE/HYDRODIURIL,) 12.5 MG capsule Take 12.5 mg by mouth daily.        . Multiple Vitamins-Minerals (CENTRUM SILVER PO) Take by mouth daily.        . pravastatin (PRAVACHOL) 20 MG tablet Take 20 mg by mouth daily.        . Probiotic Product (ALIGN) 4 MG CAPS Take 1 capsule by mouth daily.       No current facility-administered medications for this encounter.    Physical Exam:  Filed Vitals:   10/21/12 1255  BP: 194/75  Pulse: 77  Temp: 98.2 F (36.8 C)  Resp: 20   she is pleasant female in no distress sitting comfortably examining table. She has external hemorrhoids. She has normal female genitalia. Examination of the vaginal cuff  reveals no evidence of disease recurrence. She has a sebaceous cyst in the left groin.  IMPRESSION: Sundeep is a 72 y.o. female status post hysterectomy and HDR brachytherapy with no evidence of disease recurrence  PLAN:  Sina looks great. Her NCCN guidelines she can released every six-month followup. She has a scheduled with her GYN and Dr. Nelly Rout. I have released her from followup with me. I be happy to see her back on a when necessary basis. She knows she can always contact me with any questions or concerns.    Lurline Hare, MD

## 2012-10-24 DIAGNOSIS — Z1331 Encounter for screening for depression: Secondary | ICD-10-CM | POA: Diagnosis not present

## 2012-10-24 DIAGNOSIS — C549 Malignant neoplasm of corpus uteri, unspecified: Secondary | ICD-10-CM | POA: Diagnosis not present

## 2012-10-24 DIAGNOSIS — I10 Essential (primary) hypertension: Secondary | ICD-10-CM | POA: Diagnosis not present

## 2012-10-24 DIAGNOSIS — R7301 Impaired fasting glucose: Secondary | ICD-10-CM | POA: Diagnosis not present

## 2012-10-24 DIAGNOSIS — M810 Age-related osteoporosis without current pathological fracture: Secondary | ICD-10-CM | POA: Diagnosis not present

## 2012-12-21 DIAGNOSIS — H5000 Unspecified esotropia: Secondary | ICD-10-CM | POA: Diagnosis not present

## 2012-12-21 DIAGNOSIS — H53039 Strabismic amblyopia, unspecified eye: Secondary | ICD-10-CM | POA: Diagnosis not present

## 2012-12-21 DIAGNOSIS — Z961 Presence of intraocular lens: Secondary | ICD-10-CM | POA: Diagnosis not present

## 2012-12-27 DIAGNOSIS — M25579 Pain in unspecified ankle and joints of unspecified foot: Secondary | ICD-10-CM | POA: Diagnosis not present

## 2013-01-23 ENCOUNTER — Other Ambulatory Visit (HOSPITAL_COMMUNITY)
Admission: RE | Admit: 2013-01-23 | Discharge: 2013-01-23 | Disposition: A | Discharge status: Home / Self Care | Payer: Medicare Other | Source: Ambulatory Visit | Attending: Gynecology | Admitting: Gynecology

## 2013-01-23 ENCOUNTER — Ambulatory Visit (INDEPENDENT_AMBULATORY_CARE_PROVIDER_SITE_OTHER): Payer: Medicare Other | Admitting: Gynecology

## 2013-01-23 ENCOUNTER — Encounter: Payer: Self-pay | Admitting: Gynecology

## 2013-01-23 VITALS — BP 134/84 | Ht 64.5 in | Wt 132.0 lb

## 2013-01-23 DIAGNOSIS — Z124 Encounter for screening for malignant neoplasm of cervix: Secondary | ICD-10-CM | POA: Diagnosis not present

## 2013-01-23 DIAGNOSIS — M8580 Other specified disorders of bone density and structure, unspecified site: Secondary | ICD-10-CM

## 2013-01-23 DIAGNOSIS — R82998 Other abnormal findings in urine: Secondary | ICD-10-CM | POA: Diagnosis not present

## 2013-01-23 DIAGNOSIS — N952 Postmenopausal atrophic vaginitis: Secondary | ICD-10-CM | POA: Diagnosis not present

## 2013-01-23 DIAGNOSIS — N816 Rectocele: Secondary | ICD-10-CM

## 2013-01-23 DIAGNOSIS — M899 Disorder of bone, unspecified: Secondary | ICD-10-CM | POA: Diagnosis not present

## 2013-01-23 DIAGNOSIS — M949 Disorder of cartilage, unspecified: Secondary | ICD-10-CM | POA: Diagnosis not present

## 2013-01-23 DIAGNOSIS — C55 Malignant neoplasm of uterus, part unspecified: Secondary | ICD-10-CM

## 2013-01-23 NOTE — Progress Notes (Signed)
Victoria Wyatt 24-Sep-1940 [id number]        72 y.o.  [id number] for followup exam.  Several issues that are below.  Past medical history,surgical history, medications, allergies, family history and social history were all reviewed and documented in the EPIC chart.  ROS:  Performed and pertinent positives and negatives are included in the history, assessment and plan .  Exam: [name] assistant Filed Vitals:   01/23/13 1002  BP: 134/84  Height: 5' 4.5" (1.638 m)  Weight: 132 lb (59.875 kg)   General appearance  Normal Skin grossly normal Head/Neck normal with no cervical or supraclavicular adenopathy thyroid normal Lungs  clear Cardiac RR, without RMG Abdominal  soft, nontender, without masses, organomegaly or hernia Breasts  examined lying and sitting without masses, retractions, discharge or axillary adenopathy. Pelvic  Ext/BUS/vagina  Atrophic changes. First degree rectocele with examination.  Adnexa  Without masses or tenderness    Anus and perineum  normal   Rectovaginal  normal sphincter tone without palpated masses or tenderness. First degree rectocele.   Assessment/Plan:  72 y.o. [id number] female for annual exam.   1. Stage IB, grade 2 endometrial carcinoma. Exam is normal. Pap of cuff done. We'll follow up with Dr. Nelly Rout in 6 months as we are alternating exams. 2. Rectocele. First degree rectocele. Asymptomatic to the patient. Cuff and bladder well supported. Continue observation. Patient will followup if she develops any symptoms from this. 3. History of hematuria. Saw Dr. Nelly Rout who did not feel workup needed at that time but if any recurrence would pursue a urologic evaluation. Patient has not had any recurrent hematuria. Will check UA today. 4. Atrophic genital changes. Patient asymptomatic. We'll continue to follow. 5. Mammography 07/2012. We'll continue with annual mammography. SBE monthly reviewed. 6. Colonoscopy 5 years ago. Repeat at their recommended  interval. 7. Osteopenia. DEXA 07/2012 with T score -1.8. FRAX unable to do with history of prior Fosamax treatment. Patient relates being on Fosamax for approximately 7-8 years. Has been off of this now for 5 years. Spine unable to evaluate due to degenerative changes. Right and left hips without significant change from prior DEXA. We'll plan observation at present with repeat DEXA in 2 years. Increase calcium vitamin D reviewed. 8. Health maintenance. Patient actively followed by her primary physician. No lab work done as it is all done through their office. Followup in 6 months with Dr. Nelly Rout sooner if any issues.     Dara Lords MD, 11:06 AM 01/23/2013

## 2013-01-23 NOTE — Patient Instructions (Addendum)
Followup in 6 months with appointment with Dr. Nelly Rout.

## 2013-01-24 LAB — URINALYSIS W MICROSCOPIC + REFLEX CULTURE
Bacteria, UA: NONE SEEN
Casts: NONE SEEN
Crystals: NONE SEEN
Glucose, UA: NEGATIVE mg/dL
Hgb urine dipstick: NEGATIVE
Ketones, ur: NEGATIVE mg/dL
Leukocytes, UA: NEGATIVE
Specific Gravity, Urine: <1.005 — ABNORMAL LOW (ref 1.005–1.030)
pH: 7.5 (ref 5.0–8.0)

## 2013-01-25 LAB — URINE CULTURE: Colony Count: 9000

## 2013-02-28 DIAGNOSIS — R7301 Impaired fasting glucose: Secondary | ICD-10-CM | POA: Diagnosis not present

## 2013-03-09 DIAGNOSIS — R7301 Impaired fasting glucose: Secondary | ICD-10-CM | POA: Diagnosis not present

## 2013-03-27 ENCOUNTER — Telehealth: Payer: Self-pay | Admitting: *Deleted

## 2013-03-27 NOTE — Telephone Encounter (Signed)
Pt informed with the below note, I told her I will set up appointment and call her back with time and date. Any day is fine except thursdays.

## 2013-03-27 NOTE — Telephone Encounter (Signed)
Yes, needs appointment with urologist.

## 2013-03-27 NOTE — Telephone Encounter (Signed)
Pt called to follow up regarding blood in urine, pt said on Saturday she noticed some blood in urine, not a lot. Pt has not seen any blood since Saturday, no pain, feels fine. She asked if you want her to see urologist? Please advise

## 2013-03-28 NOTE — Telephone Encounter (Signed)
appt on 04/10/13 @ 12:30 with Dr.Herrick at [facility] urology, pt informed.

## 2013-04-10 DIAGNOSIS — R3129 Other microscopic hematuria: Secondary | ICD-10-CM | POA: Diagnosis not present

## 2013-04-19 DIAGNOSIS — D3000 Benign neoplasm of unspecified kidney: Secondary | ICD-10-CM | POA: Diagnosis not present

## 2013-05-12 DIAGNOSIS — Z23 Encounter for immunization: Secondary | ICD-10-CM | POA: Diagnosis not present

## 2013-06-06 ENCOUNTER — Encounter (INDEPENDENT_AMBULATORY_CARE_PROVIDER_SITE_OTHER): Payer: Self-pay

## 2013-06-06 ENCOUNTER — Ambulatory Visit: Payer: Medicare Other | Attending: Gynecologic Oncology | Admitting: Gynecologic Oncology

## 2013-06-06 ENCOUNTER — Encounter: Payer: Self-pay | Admitting: Gynecologic Oncology

## 2013-06-06 VITALS — BP 181/83 | HR 73 | Temp 98.2°F | Resp 16 | Ht 64.0 in | Wt 128.4 lb

## 2013-06-06 DIAGNOSIS — C549 Malignant neoplasm of corpus uteri, unspecified: Secondary | ICD-10-CM | POA: Diagnosis not present

## 2013-06-06 DIAGNOSIS — C541 Malignant neoplasm of endometrium: Secondary | ICD-10-CM

## 2013-06-06 NOTE — Progress Notes (Signed)
GYNECOLOGIC ONCOLOGY  Victoria Wyatt 72 y.o. female  CC:  Chief Complaint  Patient presents with  . Endometrial Cancer    Follow up    HPI:   72 y.o. diagnosed with a grade 2 endometrioid adenocarcinoma in early 2011.  On Dec 03, 2009, she underwent a total abdominal hysterectomy, bilateral salpingo-oophorectomy, bilateral pelvic and periaortic lymph node sampling.  Pathology was notable for an invasive endometrioid adenocarcinoma grade 2, invading into outer half of the endometrium.  She was a stage IB grade 2.  She underwent adjuvant high-dose rate brachytherapy to a total dose of 50 Gy in 5 fractions, which was completed on February 27, 2010.    Interval History:   In May 2013 hematuria was noted it lasted approximately 3 days, self limiting. Followup urinalysis was negative for hematuria. Second episode occurred  03/2013 and she was seen by Dr. Marlou Porch (urology). Per the patient cystoscopy was negative.  CT scan notable for a benign appearing renal mass.  Patient denies any further occurrences. She denies nausea vomiting, changes in weight early satiety back pain headache cough bleeding from the rectum or the vagina.  Review of Systems Constitutional  Feels well no complaints Cardiovascular  No chest pain, shortness of breath, or edema  Pulmonary  No cough or wheeze. Gastro Intestinal  No nausea, vomitting, or diarrhoea. No bright red blood per rectum, no abdominal pain, change in bowel movement, or constipation.  Genito Urinary  No frequency, urgency, dysuria, no vaginal bleeding. Pap test this year was within normal limits Musculo Skeletal  No myalgia, joint swelling or pain .  Reports significant arthritis. Neurologic  No weakness, numbness, change in gait,  Psychology  No depression, anxiety, insomnia.   Social Hx:  No interval changes.  Very happy.  Past Surgical Hx:  Past Surgical History  Procedure Laterality Date  . Total abdominal hysterectomy w/ bilateral  salpingoophorectomy  12/03/2009  . Cataract extraction      bi-lat  . Dilation and curettage of uterus    . Bilateral blepharoplasty  May 2011  . Abdominal hysterectomy      Past Medical Hx:  Past Medical History  Diagnosis Date  . Hypercholesteremia   . Hypertension   . Fibroid   . Endometrial cancer 2011  . Malignant neoplasm of body of uterus 07/02/2011    Stage IB, grade 2  . Basal cell cancer   . Osteopenia 06/2010    T score -1.5  . Hx of radiation therapy 01/2010    HDR x 5 fractions    Family Hx:  Family History  Problem Relation Age of Onset  . Breast cancer Sister 107  . Heart disease Sister   . Breast cancer Daughter 7  . Emphysema Mother   . Hypertension Father   . Emphysema Father   . Breast cancer Daughter 53    Vitals:  Blood pressure 181/83, pulse 73, temperature 98.2 F (36.8 C), temperature source Oral, resp. rate 16, height 5\' 4"  (1.626 m), weight 128 lb 6.4 oz (58.242 kg).  Physical Exam: Neck  Supple without any enlargements.  Cardiovascular  Pulse normal rate, regularity and rhythm. S1 and S2 normal, .  Lungs  Clear to auscultation bilateraly, without wheezes/crackles/rhonchi. Good air movement.  Psychiatry  Alert and oriented to person, place, and time  Abdomen  Normoactive bowel sounds, abdomen soft, non-tender.  Midline surgical incision notable for approximately 1.5 cm hernia at the superior aspect of the incision, unchanged, no bowel present no masses  no ascites identified. Genito Urinary  Vulva/vagina: Normal external female genitalia.  No lesions. No discharge or bleeding. Vagina is atrophic without any palpable masses  Bladder/urethra:  No lesions or masses  No cul-de-sac masses  Rectal  Good tone, no masses no cul de sac nodularity.  Extremities  No bilateral cyanosis, clubbing or edema. No rash, lesions or petiche.    Assessment/Plan: Victoria Wyatt has no evidence of disease at this visit from her stage IB grade 2 endometrioid  adenocarcinoma  No evidence of disease since completion of treatment on 02/27/2010  Victoria Wyatt has been advised to f/u with  urology for further episodes of hematuria Followup with Dr. Audie Box in 6 months  Followup with GYN oncology in 12 months  Laurette Schimke, MD 06/06/2013, 10:28 AM

## 2013-06-06 NOTE — Patient Instructions (Signed)
F/U with Dr. Audie Box in 01/2014 F/U with Gyn Onc in 07/2014 No evidence of disease.

## 2013-07-02 DIAGNOSIS — N2889 Other specified disorders of kidney and ureter: Secondary | ICD-10-CM | POA: Insufficient documentation

## 2013-07-12 DIAGNOSIS — Z803 Family history of malignant neoplasm of breast: Secondary | ICD-10-CM | POA: Diagnosis not present

## 2013-07-12 DIAGNOSIS — Z1231 Encounter for screening mammogram for malignant neoplasm of breast: Secondary | ICD-10-CM | POA: Diagnosis not present

## 2013-07-13 ENCOUNTER — Encounter: Payer: Self-pay | Admitting: Gynecology

## 2013-07-20 DIAGNOSIS — R7301 Impaired fasting glucose: Secondary | ICD-10-CM | POA: Diagnosis not present

## 2013-09-01 ENCOUNTER — Other Ambulatory Visit: Payer: Self-pay | Admitting: Dermatology

## 2013-09-01 DIAGNOSIS — D239 Other benign neoplasm of skin, unspecified: Secondary | ICD-10-CM | POA: Diagnosis not present

## 2013-09-01 DIAGNOSIS — L820 Inflamed seborrheic keratosis: Secondary | ICD-10-CM | POA: Diagnosis not present

## 2013-09-01 DIAGNOSIS — L570 Actinic keratosis: Secondary | ICD-10-CM | POA: Diagnosis not present

## 2013-09-01 DIAGNOSIS — D1739 Benign lipomatous neoplasm of skin and subcutaneous tissue of other sites: Secondary | ICD-10-CM | POA: Diagnosis not present

## 2013-09-01 DIAGNOSIS — L910 Hypertrophic scar: Secondary | ICD-10-CM | POA: Diagnosis not present

## 2013-09-01 DIAGNOSIS — D485 Neoplasm of uncertain behavior of skin: Secondary | ICD-10-CM | POA: Diagnosis not present

## 2013-09-01 DIAGNOSIS — C44319 Basal cell carcinoma of skin of other parts of face: Secondary | ICD-10-CM | POA: Diagnosis not present

## 2013-09-01 DIAGNOSIS — L821 Other seborrheic keratosis: Secondary | ICD-10-CM | POA: Diagnosis not present

## 2013-09-01 DIAGNOSIS — Z85828 Personal history of other malignant neoplasm of skin: Secondary | ICD-10-CM | POA: Diagnosis not present

## 2013-10-24 DIAGNOSIS — C44319 Basal cell carcinoma of skin of other parts of face: Secondary | ICD-10-CM | POA: Diagnosis not present

## 2013-10-27 DIAGNOSIS — Z1331 Encounter for screening for depression: Secondary | ICD-10-CM | POA: Diagnosis not present

## 2013-10-27 DIAGNOSIS — Z23 Encounter for immunization: Secondary | ICD-10-CM | POA: Diagnosis not present

## 2013-10-27 DIAGNOSIS — R7301 Impaired fasting glucose: Secondary | ICD-10-CM | POA: Diagnosis not present

## 2013-10-27 DIAGNOSIS — E7800 Pure hypercholesterolemia, unspecified: Secondary | ICD-10-CM | POA: Diagnosis not present

## 2013-10-27 DIAGNOSIS — M810 Age-related osteoporosis without current pathological fracture: Secondary | ICD-10-CM | POA: Diagnosis not present

## 2013-10-27 DIAGNOSIS — Z86010 Personal history of colonic polyps: Secondary | ICD-10-CM | POA: Diagnosis not present

## 2013-10-27 DIAGNOSIS — I10 Essential (primary) hypertension: Secondary | ICD-10-CM | POA: Diagnosis not present

## 2013-12-05 DIAGNOSIS — C44319 Basal cell carcinoma of skin of other parts of face: Secondary | ICD-10-CM | POA: Diagnosis not present

## 2014-01-24 ENCOUNTER — Encounter: Payer: Self-pay | Admitting: Gynecology

## 2014-01-24 ENCOUNTER — Other Ambulatory Visit (HOSPITAL_COMMUNITY)
Admission: RE | Admit: 2014-01-24 | Discharge: 2014-01-24 | Disposition: A | Discharge status: Home / Self Care | Payer: Medicare Other | Source: Ambulatory Visit | Attending: Gynecology | Admitting: Gynecology

## 2014-01-24 ENCOUNTER — Telehealth: Payer: Self-pay | Admitting: *Deleted

## 2014-01-24 ENCOUNTER — Ambulatory Visit (INDEPENDENT_AMBULATORY_CARE_PROVIDER_SITE_OTHER): Payer: Medicare Other | Admitting: Gynecology

## 2014-01-24 VITALS — BP 130/80 | Ht 65.0 in | Wt 129.0 lb

## 2014-01-24 DIAGNOSIS — M8580 Other specified disorders of bone density and structure, unspecified site: Secondary | ICD-10-CM

## 2014-01-24 DIAGNOSIS — Z1272 Encounter for screening for malignant neoplasm of vagina: Secondary | ICD-10-CM

## 2014-01-24 DIAGNOSIS — N952 Postmenopausal atrophic vaginitis: Secondary | ICD-10-CM | POA: Diagnosis not present

## 2014-01-24 DIAGNOSIS — N816 Rectocele: Secondary | ICD-10-CM | POA: Diagnosis not present

## 2014-01-24 DIAGNOSIS — M899 Disorder of bone, unspecified: Secondary | ICD-10-CM

## 2014-01-24 DIAGNOSIS — C549 Malignant neoplasm of corpus uteri, unspecified: Secondary | ICD-10-CM | POA: Diagnosis not present

## 2014-01-24 DIAGNOSIS — M949 Disorder of cartilage, unspecified: Secondary | ICD-10-CM

## 2014-01-24 DIAGNOSIS — Z124 Encounter for screening for malignant neoplasm of cervix: Secondary | ICD-10-CM | POA: Insufficient documentation

## 2014-01-24 DIAGNOSIS — C541 Malignant neoplasm of endometrium: Secondary | ICD-10-CM

## 2014-01-24 DIAGNOSIS — N63 Unspecified lump in unspecified breast: Secondary | ICD-10-CM

## 2014-01-24 NOTE — Progress Notes (Addendum)
Patient ID: Victoria Wyatt, female   DOB: 1941-06-19, 73 y.o.   MRN: [id number] Victoria Wyatt 1940-09-11 [id number]        73 y.o.  [id number] for followup exam. Several issues noted below.  Past medical history,surgical history, problem list, medications, allergies, family history and social history were all reviewed and documented as reviewed in the EPIC chart.  ROS:  12 system ROS performed with pertinent positives and negatives included in the history, assessment and plan.  Included Systems: General, HEENT, Neck, Cardiovascular, Pulmonary, Gastrointestinal, Genitourinary, Musculoskeletal, Dermatologic, Endocrine, Hematological, Neurologic, Psychiatric Additional significant findings :  None   Exam: [name] assistant Filed Vitals:   01/24/14 0949  BP: 130/80  Height: 5\' 5"  (1.651 m)  Weight: 129 lb (58.514 kg)   General appearance:  Normal affect, orientation and appearance. Skin: Grossly normal HEENT: Without gross lesions.  No cervical or supraclavicular adenopathy. Thyroid normal.  Lungs:  Clear without wheezing, rales or rhonchi Cardiac: RR, without RMG Abdominal:  Soft, nontender, without masses, guarding, rebound, organomegaly or hernia Breasts:  Examined lying and sitting. Right without masses, retractions, discharge or axillary adenopathy. Left with lipomatous feeling 2 cm mass 6:00 position periphery of the breast. No overlying skin changes nipple discharge or axillary adenopathy. Physical Exam  Pulmonary/Chest:      Pelvic:  Ext/BUS/vagina with generalized atrophic changes. Pap of cuff done. Mild rectocele noted.  Adnexa  Without masses or tenderness    Anus and perineum  Normal   Rectovaginal  Normal sphincter tone without palpated masses or tenderness.    Assessment/Plan:  73 y.o. [name] female for followup exam.   1. Stage IB, grade 2 endometrial carcinoma. Exam is normal. Pap of cuff done. We'll follow up with Dr. Skeet Latch in 6 months as we are alternating  exams. 2. Left breast mass. Feels lipomatous. She does have several lipomas other places in her body. Begin with diagnostic mammography ultrasound of this area. Last mammogram 07/2013 normal.  She does have a history of 2 daughters with breast cancer both premenopausal. Also a sister with a history of breast cancer at age 56. I reviewed the possibility of genetic linkage and recommended that she pursue genetic screening and counseling. Neither of her daughters historically underwent genetic screening. She is status post TAH/BSO. Risk reductive surgery to include mastectomy or increased surveillance such as MRIs discussed. Patient declines genetic screening or increased surveillance. 3. Rectocele. First degree rectocele. Asymptomatic to  4. the patient. Cuff and bladder well supported. Continue observation. Patient will followup if she develops any symptoms from this. 5. History of hematuria. Had evaluation by urology to include a renal CT and cystoscopy reportedly negative. Recheck UA today. 6. Atrophic genital changes. Patient asymptomatic. We'll continue to follow. 7. Osteopenia. DEXA 07/2012 with T score -1.8. FRAX unable to do with history of prior Fosamax treatment. Patient relates being on Fosamax for approximately 7-8 years. Has been off of this now for 6 years. Spine unable to evaluate due to degenerative changes. Right and left hips without significant change from prior DEXA. We'll plan observation at present with repeat DEXA in another year or 2. Increase calcium vitamin D reviewed. 8. Colonoscopy due now as recommended by her gastroenterologist. I strongly urged her to schedule this. 9. Health maintenance. Patient actively followed by her primary physician. No blood work done as it is all done through their office. Followup in 6 months with Dr. Skeet Latch, sooner if any issues.    Note: This document was prepared with  digital dictation and possible smart Company secretary. Any transcriptional  errors that result from this process are unintentional.   Anastasio Auerbach MD, 10:52 AM 01/24/2014

## 2014-01-24 NOTE — Telephone Encounter (Signed)
Message copied by Thamas Jaegers on Wed Jan 24, 2014 11:40 AM ------      Message from: Anastasio Auerbach      Created: Wed Jan 24, 2014 10:17 AM       Schedule diagnostic mammogram and ultrasound reference 2 cm lipomatous feeling mass periphery left breast 6:00 position ------

## 2014-01-24 NOTE — Patient Instructions (Signed)
Followup for mammography and ultrasound as arranged by office. Followup with Dr. Skeet Latch in December. Followup with me in one year, sooner if any issues.

## 2014-01-24 NOTE — Addendum Note (Signed)
Addended by: Nelva Nay on: 01/24/2014 11:12 AM   Modules accepted: Orders

## 2014-01-24 NOTE — Telephone Encounter (Signed)
Appointment on 01/29/14 @ 2:30 pm at [place] left this on pt voicemail

## 2014-01-25 LAB — URINALYSIS W MICROSCOPIC + REFLEX CULTURE
Bacteria, UA: NONE SEEN
Bilirubin Urine: NEGATIVE
CASTS: NONE SEEN
Crystals: NONE SEEN
Glucose, UA: NEGATIVE mg/dL
HGB URINE DIPSTICK: NEGATIVE
Ketones, ur: NEGATIVE mg/dL
LEUKOCYTES UA: NEGATIVE
NITRITE: NEGATIVE
PH: 7 (ref 5.0–8.0)
PROTEIN: NEGATIVE mg/dL
Specific Gravity, Urine: 1.005 (ref 1.005–1.030)
Squamous Epithelial / LPF: NONE SEEN
Urobilinogen, UA: 0.2 mg/dL (ref 0.0–1.0)

## 2014-01-25 LAB — CYTOLOGY - PAP

## 2014-01-29 DIAGNOSIS — Z803 Family history of malignant neoplasm of breast: Secondary | ICD-10-CM | POA: Diagnosis not present

## 2014-01-29 DIAGNOSIS — N63 Unspecified lump in unspecified breast: Secondary | ICD-10-CM | POA: Diagnosis not present

## 2014-01-29 DIAGNOSIS — N6459 Other signs and symptoms in breast: Secondary | ICD-10-CM | POA: Diagnosis not present

## 2014-01-30 ENCOUNTER — Encounter: Payer: Self-pay | Admitting: Gynecology

## 2014-05-02 DIAGNOSIS — R7301 Impaired fasting glucose: Secondary | ICD-10-CM | POA: Diagnosis not present

## 2014-05-02 DIAGNOSIS — Z23 Encounter for immunization: Secondary | ICD-10-CM | POA: Diagnosis not present

## 2014-05-04 DIAGNOSIS — D3001 Benign neoplasm of right kidney: Secondary | ICD-10-CM | POA: Diagnosis not present

## 2014-06-04 ENCOUNTER — Encounter: Payer: Self-pay | Admitting: Gynecology

## 2014-06-08 ENCOUNTER — Other Ambulatory Visit: Payer: Self-pay | Admitting: Dermatology

## 2014-06-08 DIAGNOSIS — Z85828 Personal history of other malignant neoplasm of skin: Secondary | ICD-10-CM | POA: Diagnosis not present

## 2014-06-08 DIAGNOSIS — D485 Neoplasm of uncertain behavior of skin: Secondary | ICD-10-CM | POA: Diagnosis not present

## 2014-06-08 DIAGNOSIS — C44629 Squamous cell carcinoma of skin of left upper limb, including shoulder: Secondary | ICD-10-CM | POA: Diagnosis not present

## 2014-07-01 ENCOUNTER — Telehealth: Payer: Self-pay | Admitting: Gynecologic Oncology

## 2014-07-01 NOTE — Telephone Encounter (Signed)
GYNECOLOGIC ONCOLOGY  Victoria Wyatt 73 y.o. female  CC:  Endometrial cancer surveillance  HPI:   73 y.o. diagnosed with a grade 2 endometrioid adenocarcinoma in early 2011.  On Dec 03, 2009, she underwent a total abdominal hysterectomy, bilateral salpingo-oophorectomy, bilateral pelvic and periaortic lymph node sampling.  Pathology was notable for an invasive endometrioid adenocarcinoma grade 2, invading into outer half of the endometrium.  She was a stage IB grade 2.  She underwent adjuvant high-dose rate brachytherapy to a total dose of 50 Gy in 5 fractions, which was completed on February 27, 2010.    Interval History:   In May 2013 hematuria was noted it lasted approximately 3 days, self limiting. Followup urinalysis was negative for hematuria. Second episode occurred  03/2013 and she was seen by Dr. [name] (urology). Per the patient cystoscopy was negative.  CT scan notable for a benign appearing renal mass.  Patient denies any further occurrences. She denies nausea vomiting, changes in weight early satiety back pain headache cough bleeding from the rectum or the vagina.  Review of Systems Constitutional  Feels well no complaints Cardiovascular  No chest pain, shortness of breath, or edema  Pulmonary  No cough or wheeze. Gastro Intestinal  No nausea, vomitting, or diarrhoea. No bright red blood per rectum, no abdominal pain, change in bowel movement, or constipation.  Genito Urinary  No frequency, urgency, dysuria, no vaginal bleeding. Pap test this year was within normal limits Musculo Skeletal  No myalgia, joint swelling or pain .  Reports significant arthritis. Neurologic  No weakness, numbness, change in gait,  Psychology  No depression, anxiety, insomnia.   Social Hx:  No interval changes.  Very happy.  Past Surgical Hx:  Past Surgical History  Procedure Laterality Date  . Total abdominal hysterectomy w/ bilateral salpingoophorectomy  12/03/2009  . Cataract extraction       bi-lat  . Dilation and curettage of uterus    . Bilateral blepharoplasty  May 2011  . Basal cell carcinoma excision      Past Medical Hx:  Past Medical History  Diagnosis Date  . Hypercholesteremia   . Hypertension   . Endometrial cancer 2011  . Malignant neoplasm of body of uterus 07/02/2011    Stage IB, grade 2  . Basal cell cancer   . Osteopenia 07/2012    T score -1.8 FRAX not calculated due to prior bisphosphate use  . Hx of radiation therapy 01/2010    HDR x 5 fractions    Family Hx:  Family History  Problem Relation Age of Onset  . Breast cancer Sister 88  . Heart disease Sister   . Breast cancer Daughter 91  . Emphysema Mother   . Hypertension Father   . Emphysema Father   . Breast cancer Daughter 70    Vitals:  Blood pressure 181/83, pulse 73, temperature 98.2 F (36.8 C), temperature source Oral, resp. rate 16, height 5\' 4"  (1.626 m), weight 128 lb 6.4 oz (58.242 kg).  Physical Exam: Neck  Supple without any enlargements.  Cardiovascular  Pulse normal rate, regularity and rhythm. S1 and S2 normal, .  Lungs  Clear to auscultation bilateraly, without wheezes/crackles/rhonchi. Good air movement.  Psychiatry  Alert and oriented to person, place, and time  Abdomen  Normoactive bowel sounds, abdomen soft, non-tender.  Midline surgical incision notable for approximately 1.5 cm hernia at the superior aspect of the incision, unchanged, no bowel present no masses no ascites identified. Genito Urinary  Vulva/vagina: Normal  external female genitalia.  No lesions. No discharge or bleeding. Vagina is atrophic without any palpable masses  Bladder/urethra:  No lesions or masses  No cul-de-sac masses  Rectal  Good tone, no masses no cul de sac nodularity.  Extremities  No bilateral cyanosis, clubbing or edema. No rash, lesions or petiche.    Assessment/Plan: Victoria Wyatt has no evidence of disease at this visit from her stage IB grade 2 endometrioid  adenocarcinoma  No evidence of disease since completion of treatment on 02/27/2010  Victoria Wyatt has been advised to f/u with  urology for further episodes of hematuria Followup with [name] in 12 months  Followup with GYN oncology as indicated

## 2014-07-02 ENCOUNTER — Encounter: Payer: Self-pay | Admitting: Gynecologic Oncology

## 2014-07-02 ENCOUNTER — Ambulatory Visit: Payer: Medicare Other | Attending: Gynecologic Oncology | Admitting: Gynecologic Oncology

## 2014-07-02 VITALS — BP 173/66 | HR 66 | Temp 98.6°F | Resp 20 | Ht 65.0 in | Wt 127.9 lb

## 2014-07-02 DIAGNOSIS — Z90710 Acquired absence of both cervix and uterus: Secondary | ICD-10-CM | POA: Diagnosis not present

## 2014-07-02 DIAGNOSIS — C4491 Basal cell carcinoma of skin, unspecified: Secondary | ICD-10-CM | POA: Diagnosis not present

## 2014-07-02 DIAGNOSIS — I10 Essential (primary) hypertension: Secondary | ICD-10-CM | POA: Insufficient documentation

## 2014-07-02 DIAGNOSIS — N2889 Other specified disorders of kidney and ureter: Secondary | ICD-10-CM | POA: Diagnosis not present

## 2014-07-02 DIAGNOSIS — E780 Pure hypercholesterolemia: Secondary | ICD-10-CM | POA: Diagnosis not present

## 2014-07-02 DIAGNOSIS — Z90722 Acquired absence of ovaries, bilateral: Secondary | ICD-10-CM | POA: Insufficient documentation

## 2014-07-02 DIAGNOSIS — Z8542 Personal history of malignant neoplasm of other parts of uterus: Secondary | ICD-10-CM | POA: Diagnosis not present

## 2014-07-02 DIAGNOSIS — Z9079 Acquired absence of other genital organ(s): Secondary | ICD-10-CM | POA: Insufficient documentation

## 2014-07-02 DIAGNOSIS — M8580 Other specified disorders of bone density and structure, unspecified site: Secondary | ICD-10-CM | POA: Insufficient documentation

## 2014-07-02 DIAGNOSIS — Z923 Personal history of irradiation: Secondary | ICD-10-CM | POA: Insufficient documentation

## 2014-07-02 DIAGNOSIS — C541 Malignant neoplasm of endometrium: Secondary | ICD-10-CM | POA: Diagnosis not present

## 2014-07-02 NOTE — Patient Instructions (Signed)
Plan to follow up with [name] in six months or sooner if needed.  Please call for any questions or concerns.

## 2014-07-02 NOTE — Progress Notes (Signed)
GYNECOLOGIC ONCOLOGY  [name] 73 y.o. female  CC:  Endometrial cancer surveillance   Assessment/Plan: Ms. Victoria Wyatt has no evidence of disease at this visit from her stage IB grade 2 endometrioid adenocarcinoma  No evidence of disease since completion of treatment on 02/27/2010  Followup with [name] in 12 months  Followup with GYN oncology as indicated  HPI:   73 y.o. diagnosed with a grade 2 endometrioid adenocarcinoma in early 2011.  On Dec 03, 2009, she underwent a total abdominal hysterectomy, bilateral salpingo-oophorectomy, bilateral pelvic and periaortic lymph node sampling.  Pathology was notable for an invasive endometrioid adenocarcinoma grade 2, invading into outer half of the endometrium.  She was a stage IB grade 2.  She underwent adjuvant high-dose rate brachytherapy to a total dose of 50 Gy in 5 fractions, which was completed on February 27, 2010.   In May 2013 hematuria was noted it lasted approximately 3 days, self limiting. Followup urinalysis was negative for hematuria. Second episode occurred  03/2013 and she was seen by Dr. [name] (urology). Per the patient cystoscopy was negative.  CT scan notable for a benign appearing renal mass.  Patient denies any further occurrences.  Repeat sonogram without evidence of a mass.    Review of Systems Constitutional  Feels well no complaints Cardiovascular  No chest pain, shortness of breath, or edema  Pulmonary  No cough or wheeze. Gastro Intestinal  No nausea, vomitting, or diarrhoea. No bright red blood per rectum, no abdominal pain, change in bowel movement, or constipation.  Genito Urinary  No frequency, urgency, dysuria, no vaginal bleeding. Pap test this year was within normal limits Musculo Skeletal  No myalgia, joint swelling or pain .  Neurologic  No weakness, numbness, change in gait,  Psychology  No depression, anxiety, insomnia.   Social Hx:  Daughters both diagnosed with second primary cancers this  year.    Past Surgical Hx:  Past Surgical History  Procedure Laterality Date  . Total abdominal hysterectomy w/ bilateral salpingoophorectomy  12/03/2009  . Cataract extraction      bi-lat  . Dilation and curettage of uterus    . Bilateral blepharoplasty  May 2011  . Basal cell carcinoma excision    Mammogram scheduled in 2 weeks.  Past Medical Hx:  Past Medical History  Diagnosis Date  . Hypercholesteremia   . Hypertension   . Endometrial cancer 2011  . Malignant neoplasm of body of uterus 07/02/2011    Stage IB, grade 2  . Basal cell cancer   . Osteopenia 07/2012    T score -1.8 FRAX not calculated due to prior bisphosphate use  . Hx of radiation therapy 01/2010    HDR x 5 fractions    Family Hx:  Family History  Problem Relation Age of Onset  . Breast cancer Sister 41  . Heart disease Sister   . Breast cancer Daughter 33  . Emphysema Mother   . Hypertension Father   . Emphysema Father   . Breast cancer Daughter 26  Both daughters diagnosed with second primary malignancies. Younger daughter  Melanoma (63y/o).  Older daughter has a contralateral breast cancer (75 y/o)  Vitals:  Blood pressure 173/66, pulse 66, temperature 98.6 F (37 C), temperature source Oral, resp. rate 20, height 5\' 5"  (1.651 m), weight 127 lb 14.4 oz (58.015 kg).  Physical Exam: Neck  Supple without any enlargements.  Cardiovascular  Pulse normal rate, regularity and rhythm. S1 and S2 normal, .  Lungs  Clear  to auscultation bilateraly, without wheezes/crackles/rhonchi. Good air movement.  Psychiatry  Alert and oriented to person, place, and time  Abdomen  Normoactive bowel sounds, abdomen soft, non-tender.  Midline surgical incision notable for approximately 1.5 cm hernia at the superior aspect of the incision, unchanged, no bowel present no masses no ascites identified. Genito Urinary  Vulva/vagina: Normal external female genitalia.  No lesions. No discharge or bleeding. Vagina is atrophic  without any palpable masses  Bladder/urethra:  No lesions or masses  No cul-de-sac masses  Rectal  Good tone, no masses no cul de sac nodularity.  Extremities  No bilateral cyanosis, clubbing or edema. No rash, lesions or petiche.

## 2014-07-13 DIAGNOSIS — Z803 Family history of malignant neoplasm of breast: Secondary | ICD-10-CM | POA: Diagnosis not present

## 2014-07-13 DIAGNOSIS — N63 Unspecified lump in breast: Secondary | ICD-10-CM | POA: Diagnosis not present

## 2014-07-13 DIAGNOSIS — D171 Benign lipomatous neoplasm of skin and subcutaneous tissue of trunk: Secondary | ICD-10-CM | POA: Diagnosis not present

## 2014-07-17 ENCOUNTER — Encounter: Payer: Self-pay | Admitting: Gynecology

## 2014-09-07 ENCOUNTER — Other Ambulatory Visit: Payer: Self-pay | Admitting: Dermatology

## 2014-09-07 DIAGNOSIS — D225 Melanocytic nevi of trunk: Secondary | ICD-10-CM | POA: Diagnosis not present

## 2014-09-07 DIAGNOSIS — C44719 Basal cell carcinoma of skin of left lower limb, including hip: Secondary | ICD-10-CM | POA: Diagnosis not present

## 2014-09-07 DIAGNOSIS — Z85828 Personal history of other malignant neoplasm of skin: Secondary | ICD-10-CM | POA: Diagnosis not present

## 2014-09-07 DIAGNOSIS — L570 Actinic keratosis: Secondary | ICD-10-CM | POA: Diagnosis not present

## 2014-09-07 DIAGNOSIS — D1721 Benign lipomatous neoplasm of skin and subcutaneous tissue of right arm: Secondary | ICD-10-CM | POA: Diagnosis not present

## 2014-09-07 DIAGNOSIS — L820 Inflamed seborrheic keratosis: Secondary | ICD-10-CM | POA: Diagnosis not present

## 2014-09-07 DIAGNOSIS — L814 Other melanin hyperpigmentation: Secondary | ICD-10-CM | POA: Diagnosis not present

## 2014-09-07 DIAGNOSIS — D1801 Hemangioma of skin and subcutaneous tissue: Secondary | ICD-10-CM | POA: Diagnosis not present

## 2014-09-07 DIAGNOSIS — L821 Other seborrheic keratosis: Secondary | ICD-10-CM | POA: Diagnosis not present

## 2014-09-07 DIAGNOSIS — D485 Neoplasm of uncertain behavior of skin: Secondary | ICD-10-CM | POA: Diagnosis not present

## 2014-10-02 DIAGNOSIS — M8580 Other specified disorders of bone density and structure, unspecified site: Secondary | ICD-10-CM

## 2014-10-02 HISTORY — DX: Other specified disorders of bone density and structure, unspecified site: M85.80

## 2014-10-12 DIAGNOSIS — M810 Age-related osteoporosis without current pathological fracture: Secondary | ICD-10-CM | POA: Diagnosis not present

## 2014-10-12 DIAGNOSIS — R7301 Impaired fasting glucose: Secondary | ICD-10-CM | POA: Diagnosis not present

## 2014-10-12 DIAGNOSIS — I10 Essential (primary) hypertension: Secondary | ICD-10-CM | POA: Diagnosis not present

## 2014-10-12 DIAGNOSIS — E780 Pure hypercholesterolemia: Secondary | ICD-10-CM | POA: Diagnosis not present

## 2014-10-12 DIAGNOSIS — C541 Malignant neoplasm of endometrium: Secondary | ICD-10-CM | POA: Diagnosis not present

## 2014-10-19 DIAGNOSIS — M8580 Other specified disorders of bone density and structure, unspecified site: Secondary | ICD-10-CM | POA: Diagnosis not present

## 2014-12-26 DIAGNOSIS — Z961 Presence of intraocular lens: Secondary | ICD-10-CM | POA: Diagnosis not present

## 2014-12-26 DIAGNOSIS — H53001 Unspecified amblyopia, right eye: Secondary | ICD-10-CM | POA: Diagnosis not present

## 2015-01-09 DIAGNOSIS — Z85828 Personal history of other malignant neoplasm of skin: Secondary | ICD-10-CM | POA: Diagnosis not present

## 2015-01-09 DIAGNOSIS — L570 Actinic keratosis: Secondary | ICD-10-CM | POA: Diagnosis not present

## 2015-01-28 ENCOUNTER — Ambulatory Visit (INDEPENDENT_AMBULATORY_CARE_PROVIDER_SITE_OTHER): Payer: Medicare Other | Admitting: Gynecology

## 2015-01-28 ENCOUNTER — Other Ambulatory Visit (HOSPITAL_COMMUNITY): Admission: RE | Admit: 2015-01-28 | Payer: Medicare Other | Source: Ambulatory Visit | Admitting: Gynecology

## 2015-01-28 ENCOUNTER — Other Ambulatory Visit (HOSPITAL_COMMUNITY)
Admission: RE | Admit: 2015-01-28 | Discharge: 2015-01-28 | Disposition: A | Discharge status: Home / Self Care | Payer: Medicare Other | Source: Ambulatory Visit | Attending: Gynecology | Admitting: Gynecology

## 2015-01-28 ENCOUNTER — Encounter: Payer: Self-pay | Admitting: Gynecology

## 2015-01-28 VITALS — BP 124/78 | Ht 65.0 in | Wt 131.0 lb

## 2015-01-28 DIAGNOSIS — M8580 Other specified disorders of bone density and structure, unspecified site: Secondary | ICD-10-CM

## 2015-01-28 DIAGNOSIS — C55 Malignant neoplasm of uterus, part unspecified: Secondary | ICD-10-CM | POA: Diagnosis not present

## 2015-01-28 DIAGNOSIS — Z124 Encounter for screening for malignant neoplasm of cervix: Secondary | ICD-10-CM | POA: Insufficient documentation

## 2015-01-28 DIAGNOSIS — Z1272 Encounter for screening for malignant neoplasm of vagina: Secondary | ICD-10-CM

## 2015-01-28 DIAGNOSIS — Z01419 Encounter for gynecological examination (general) (routine) without abnormal findings: Secondary | ICD-10-CM

## 2015-01-28 DIAGNOSIS — N952 Postmenopausal atrophic vaginitis: Secondary | ICD-10-CM | POA: Diagnosis not present

## 2015-01-28 NOTE — Progress Notes (Signed)
LACE CHENEVERT 1941/05/02 [id number]        74 y.o.  [id number] for breast and pelvic exam. Several issues noted below.  Past medical history,surgical history, problem list, medications, allergies, family history and social history were all reviewed and documented as reviewed in the EPIC chart.  ROS:  Performed with pertinent positives and negatives included in the history, assessment and plan.   Additional significant findings :  none   Exam: [name] Counsellor Vitals:   01/28/15 0927  BP: 124/78  Height: 5\' 5"  (1.651 m)  Weight: 131 lb (59.421 kg)   General appearance:  Normal affect, orientation and appearance. Skin: with numerous seborrheic keratoses of her chest abdomen and back. Numerous small lipomas over arms bilaterally HEENT: Without gross lesions.  No cervical or supraclavicular adenopathy. Thyroid normal.  Lungs:  Clear without wheezing, rales or rhonchi Cardiac: RR, without RMG Abdominal:  Soft, nontender, without masses, guarding, rebound, organomegaly or hernia Breasts:  Examined lying and sitting. Right without masses, retractions, discharge or axillary adenopathy.  Left with 2 cm lipomatous mass 6:00 position 2 fingerbreadths off the areola. No overlying skin changes, nipple discharge or axillary adenopathy. Physical Exam  Pulmonary/Chest:      Pelvic:  Ext/BUS/vagina with atrophic changes. Pap smear of cuff done  Adnexa  Without masses or tenderness    Anus and perineum  Normal   Rectovaginal  Normal sphincter tone without palpated masses or tenderness.    Assessment/Plan:  74 y.o. [id number] female for estimate pelvic exam.   1. Stage IB grade 2 endometrial carcinoma 2011. Status post TAH/BSO. She is alternating 6 month exams with Dr. Skeet Latch and myself. Pap smear of cuff done today.  Exam NED. 2. Osteopenia.  Patient just had recent DEXA and Dr. Rex Kras started her back on Fosamax. She had been on this previously but had been off of it for a number of years. I  do not have a copy of this bone density. Her last bone density showed a T score of -1.8 in 2013. We'll obtain a copy of this for our records. 3. Lipoma left breast. Stable from exam last year. Did have ultrasound workup which was consistent with lipoma. Patient will continue to monitor as long as remains unchanged will follow.  Mammography 07/2014. Continue with annual mammography. SBE monthly reviewed. 4. History of mild rectocele. Exam today really does not show any significant change. 5. Colonoscopy 2008 with reported repeat interval 10 years. 6. Health maintenance. No routine lab work done as this is done at her primary physician's office. Follow up 1 year, sooner as needed.   Anastasio Auerbach MD, 9:54 AM 01/28/2015

## 2015-01-28 NOTE — Patient Instructions (Signed)
You may obtain a copy of any labs that were done today by logging onto MyChart as outlined in the instructions provided with your AVS (after visit summary). The office will not call with normal lab results but certainly if there are any significant abnormalities then we will contact you.   Health Maintenance, Female A healthy lifestyle and preventative care can promote health and wellness.  Maintain regular health, dental, and eye exams.  Eat a healthy diet. Foods like vegetables, fruits, whole grains, low-fat dairy products, and lean protein foods contain the nutrients you need without too many calories. Decrease your intake of foods high in solid fats, added sugars, and salt. Get information about a proper diet from your caregiver, if necessary.  Regular physical exercise is one of the most important things you can do for your health. Most adults should get at least 150 minutes of moderate-intensity exercise (any activity that increases your heart rate and causes you to sweat) each week. In addition, most adults need muscle-strengthening exercises on 2 or more days a week.   Maintain a healthy weight. The body mass index (BMI) is a screening tool to identify possible weight problems. It provides an estimate of body fat based on height and weight. Your caregiver can help determine your BMI, and can help you achieve or maintain a healthy weight. For adults 20 years and older:  A BMI below 18.5 is considered underweight.  A BMI of 18.5 to 24.9 is normal.  A BMI of 25 to 29.9 is considered overweight.  A BMI of 30 and above is considered obese.  Maintain normal blood lipids and cholesterol by exercising and minimizing your intake of saturated fat. Eat a balanced diet with plenty of fruits and vegetables. Blood tests for lipids and cholesterol should begin at age 61 and be repeated every 5 years. If your lipid or cholesterol levels are high, you are over 50, or you are a high risk for heart  disease, you may need your cholesterol levels checked more frequently.Ongoing high lipid and cholesterol levels should be treated with medicines if diet and exercise are not effective.  If you smoke, find out from your caregiver how to quit. If you do not use tobacco, do not start.  Lung cancer screening is recommended for adults aged 33 80 years who are at high risk for developing lung cancer because of a history of smoking. Yearly low-dose computed tomography (CT) is recommended for people who have at least a 30-pack-year history of smoking and are a current smoker or have quit within the past 15 years. A pack year of smoking is smoking an average of 1 pack of cigarettes a day for 1 year (for example: 1 pack a day for 30 years or 2 packs a day for 15 years). Yearly screening should continue until the smoker has stopped smoking for at least 15 years. Yearly screening should also be stopped for people who develop a health problem that would prevent them from having lung cancer treatment.  If you are pregnant, do not drink alcohol. If you are breastfeeding, be very cautious about drinking alcohol. If you are not pregnant and choose to drink alcohol, do not exceed 1 drink per day. One drink is considered to be 12 ounces (355 mL) of beer, 5 ounces (148 mL) of wine, or 1.5 ounces (44 mL) of liquor.  Avoid use of street drugs. Do not share needles with anyone. Ask for help if you need support or instructions about stopping  the use of drugs.  High blood pressure causes heart disease and increases the risk of stroke. Blood pressure should be checked at least every 1 to 2 years. Ongoing high blood pressure should be treated with medicines, if weight loss and exercise are not effective.  If you are 59 to 76 years old, ask your caregiver if you should take aspirin to prevent strokes.  Diabetes screening involves taking a blood sample to check your fasting blood sugar level. This should be done once every 3  years, after age 91, if you are within normal weight and without risk factors for diabetes. Testing should be considered at a younger age or be carried out more frequently if you are overweight and have at least 1 risk factor for diabetes.  Breast cancer screening is essential preventative care for women. You should practice "breast self-awareness." This means understanding the normal appearance and feel of your breasts and may include breast self-examination. Any changes detected, no matter how small, should be reported to a caregiver. Women in their 66s and 30s should have a clinical breast exam (CBE) by a caregiver as part of a regular health exam every 1 to 3 years. After age 101, women should have a CBE every year. Starting at age 100, women should consider having a mammogram (breast X-ray) every year. Women who have a family history of breast cancer should talk to their caregiver about genetic screening. Women at a high risk of breast cancer should talk to their caregiver about having an MRI and a mammogram every year.  Breast cancer gene (BRCA)-related cancer risk assessment is recommended for women who have family members with BRCA-related cancers. BRCA-related cancers include breast, ovarian, tubal, and peritoneal cancers. Having family members with these cancers may be associated with an increased risk for harmful changes (mutations) in the breast cancer genes BRCA1 and BRCA2. Results of the assessment will determine the need for genetic counseling and BRCA1 and BRCA2 testing.  The Pap test is a screening test for cervical cancer. Women should have a Pap test starting at age 57. Between ages 25 and 35, Pap tests should be repeated every 2 years. Beginning at age 37, you should have a Pap test every 3 years as long as the past 3 Pap tests have been normal. If you had a hysterectomy for a problem that was not cancer or a condition that could lead to cancer, then you no longer need Pap tests. If you are  between ages 50 and 76, and you have had normal Pap tests going back 10 years, you no longer need Pap tests. If you have had past treatment for cervical cancer or a condition that could lead to cancer, you need Pap tests and screening for cancer for at least 20 years after your treatment. If Pap tests have been discontinued, risk factors (such as a new sexual partner) need to be reassessed to determine if screening should be resumed. Some women have medical problems that increase the chance of getting cervical cancer. In these cases, your caregiver may recommend more frequent screening and Pap tests.  The human papillomavirus (HPV) test is an additional test that may be used for cervical cancer screening. The HPV test looks for the virus that can cause the cell changes on the cervix. The cells collected during the Pap test can be tested for HPV. The HPV test could be used to screen women aged 44 years and older, and should be used in women of any age  who have unclear Pap test results. After the age of 55, women should have HPV testing at the same frequency as a Pap test.  Colorectal cancer can be detected and often prevented. Most routine colorectal cancer screening begins at the age of 44 and continues through age 20. However, your caregiver may recommend screening at an earlier age if you have risk factors for colon cancer. On a yearly basis, your caregiver may provide home test kits to check for hidden blood in the stool. Use of a small camera at the end of a tube, to directly examine the colon (sigmoidoscopy or colonoscopy), can detect the earliest forms of colorectal cancer. Talk to your caregiver about this at age 86, when routine screening begins. Direct examination of the colon should be repeated every 5 to 10 years through age 13, unless early forms of pre-cancerous polyps or small growths are found.  Hepatitis C blood testing is recommended for all people born from [birth year] through 1967 and any  individual with known risks for hepatitis C.  Practice safe sex. Use condoms and avoid high-risk sexual practices to reduce the spread of sexually transmitted infections (STIs). Sexually active women aged 36 and younger should be checked for Chlamydia, which is a common sexually transmitted infection. Older women with new or multiple partners should also be tested for Chlamydia. Testing for other STIs is recommended if you are sexually active and at increased risk.  Osteoporosis is a disease in which the bones lose minerals and strength with aging. This can result in serious bone fractures. The risk of osteoporosis can be identified using a bone density scan. Women ages 20 and over and women at risk for fractures or osteoporosis should discuss screening with their caregivers. Ask your caregiver whether you should be taking a calcium supplement or vitamin D to reduce the rate of osteoporosis.  Menopause can be associated with physical symptoms and risks. Hormone replacement therapy is available to decrease symptoms and risks. You should talk to your caregiver about whether hormone replacement therapy is right for you.  Use sunscreen. Apply sunscreen liberally and repeatedly throughout the day. You should seek shade when your shadow is shorter than you. Protect yourself by wearing long sleeves, pants, a wide-brimmed hat, and sunglasses year round, whenever you are outdoors.  Notify your caregiver of new moles or changes in moles, especially if there is a change in shape or color. Also notify your caregiver if a mole is larger than the size of a pencil eraser.  Stay current with your immunizations. Document Released: 02/02/2011 Document Revised: 11/14/2012 Document Reviewed: 02/02/2011 [facility] Patient Information 2014 [employer].

## 2015-01-28 NOTE — Addendum Note (Signed)
Addended by: Nelva Nay on: 01/28/2015 11:11 AM   Modules accepted: Orders

## 2015-01-29 ENCOUNTER — Encounter: Payer: Self-pay | Admitting: Gynecology

## 2015-01-29 ENCOUNTER — Telehealth: Payer: Self-pay | Admitting: Gynecology

## 2015-01-29 LAB — CYTOLOGY - PAP

## 2015-01-29 NOTE — Telephone Encounter (Signed)
Pt informed with the below note. 

## 2015-01-29 NOTE — Telephone Encounter (Signed)
Tell patient I reviewed her most recent bone density. There has been a 4% loss at the right hip from the prior bone density. The spine and left hip were stable. I think based on this loss and the calculated hip fracture risk of 3.7% this is why Dr. Rex Kras wanted her to start back on the Fosamax which I do not think is unreasonable.

## 2015-03-15 DIAGNOSIS — D485 Neoplasm of uncertain behavior of skin: Secondary | ICD-10-CM | POA: Diagnosis not present

## 2015-03-15 DIAGNOSIS — Z85828 Personal history of other malignant neoplasm of skin: Secondary | ICD-10-CM | POA: Diagnosis not present

## 2015-03-15 DIAGNOSIS — L820 Inflamed seborrheic keratosis: Secondary | ICD-10-CM | POA: Diagnosis not present

## 2015-04-24 DIAGNOSIS — Z23 Encounter for immunization: Secondary | ICD-10-CM | POA: Diagnosis not present

## 2015-04-24 DIAGNOSIS — E780 Pure hypercholesterolemia: Secondary | ICD-10-CM | POA: Diagnosis not present

## 2015-05-06 DIAGNOSIS — D3001 Benign neoplasm of right kidney: Secondary | ICD-10-CM | POA: Diagnosis not present

## 2015-07-17 DIAGNOSIS — Z803 Family history of malignant neoplasm of breast: Secondary | ICD-10-CM | POA: Diagnosis not present

## 2015-07-17 DIAGNOSIS — Z1231 Encounter for screening mammogram for malignant neoplasm of breast: Secondary | ICD-10-CM | POA: Diagnosis not present

## 2015-07-19 ENCOUNTER — Encounter: Payer: Self-pay | Admitting: Gynecology

## 2015-10-17 DIAGNOSIS — Z86010 Personal history of colonic polyps: Secondary | ICD-10-CM | POA: Diagnosis not present

## 2015-10-17 DIAGNOSIS — M810 Age-related osteoporosis without current pathological fracture: Secondary | ICD-10-CM | POA: Diagnosis not present

## 2015-10-17 DIAGNOSIS — R7301 Impaired fasting glucose: Secondary | ICD-10-CM | POA: Diagnosis not present

## 2015-10-17 DIAGNOSIS — E7800 Pure hypercholesterolemia, unspecified: Secondary | ICD-10-CM | POA: Diagnosis not present

## 2015-10-17 DIAGNOSIS — I10 Essential (primary) hypertension: Secondary | ICD-10-CM | POA: Diagnosis not present

## 2015-10-17 DIAGNOSIS — M899 Disorder of bone, unspecified: Secondary | ICD-10-CM | POA: Diagnosis not present

## 2015-11-13 DIAGNOSIS — L84 Corns and callosities: Secondary | ICD-10-CM | POA: Diagnosis not present

## 2015-11-13 DIAGNOSIS — D225 Melanocytic nevi of trunk: Secondary | ICD-10-CM | POA: Diagnosis not present

## 2015-11-13 DIAGNOSIS — D1801 Hemangioma of skin and subcutaneous tissue: Secondary | ICD-10-CM | POA: Diagnosis not present

## 2015-11-13 DIAGNOSIS — L820 Inflamed seborrheic keratosis: Secondary | ICD-10-CM | POA: Diagnosis not present

## 2015-11-13 DIAGNOSIS — L570 Actinic keratosis: Secondary | ICD-10-CM | POA: Diagnosis not present

## 2015-11-13 DIAGNOSIS — L821 Other seborrheic keratosis: Secondary | ICD-10-CM | POA: Diagnosis not present

## 2015-11-13 DIAGNOSIS — Z85828 Personal history of other malignant neoplasm of skin: Secondary | ICD-10-CM | POA: Diagnosis not present

## 2015-11-13 DIAGNOSIS — C44719 Basal cell carcinoma of skin of left lower limb, including hip: Secondary | ICD-10-CM | POA: Diagnosis not present

## 2015-11-13 DIAGNOSIS — L814 Other melanin hyperpigmentation: Secondary | ICD-10-CM | POA: Diagnosis not present

## 2015-11-13 DIAGNOSIS — D1722 Benign lipomatous neoplasm of skin and subcutaneous tissue of left arm: Secondary | ICD-10-CM | POA: Diagnosis not present

## 2015-11-13 DIAGNOSIS — C44712 Basal cell carcinoma of skin of right lower limb, including hip: Secondary | ICD-10-CM | POA: Diagnosis not present

## 2015-11-13 DIAGNOSIS — D2239 Melanocytic nevi of other parts of face: Secondary | ICD-10-CM | POA: Diagnosis not present

## 2015-11-13 DIAGNOSIS — D485 Neoplasm of uncertain behavior of skin: Secondary | ICD-10-CM | POA: Diagnosis not present

## 2015-11-27 DIAGNOSIS — Z85828 Personal history of other malignant neoplasm of skin: Secondary | ICD-10-CM | POA: Diagnosis not present

## 2015-11-27 DIAGNOSIS — C44712 Basal cell carcinoma of skin of right lower limb, including hip: Secondary | ICD-10-CM | POA: Diagnosis not present

## 2016-01-29 ENCOUNTER — Ambulatory Visit (INDEPENDENT_AMBULATORY_CARE_PROVIDER_SITE_OTHER): Payer: Medicare Other | Admitting: Gynecology

## 2016-01-29 ENCOUNTER — Encounter: Payer: Self-pay | Admitting: Gynecology

## 2016-01-29 VITALS — BP 122/76 | Ht 65.0 in | Wt 130.0 lb

## 2016-01-29 DIAGNOSIS — Z124 Encounter for screening for malignant neoplasm of cervix: Secondary | ICD-10-CM

## 2016-01-29 DIAGNOSIS — Z01419 Encounter for gynecological examination (general) (routine) without abnormal findings: Secondary | ICD-10-CM

## 2016-01-29 DIAGNOSIS — C541 Malignant neoplasm of endometrium: Secondary | ICD-10-CM

## 2016-01-29 DIAGNOSIS — M810 Age-related osteoporosis without current pathological fracture: Secondary | ICD-10-CM

## 2016-01-29 DIAGNOSIS — D171 Benign lipomatous neoplasm of skin and subcutaneous tissue of trunk: Secondary | ICD-10-CM

## 2016-01-29 DIAGNOSIS — N952 Postmenopausal atrophic vaginitis: Secondary | ICD-10-CM

## 2016-01-29 DIAGNOSIS — M8580 Other specified disorders of bone density and structure, unspecified site: Secondary | ICD-10-CM

## 2016-01-29 DIAGNOSIS — D1779 Benign lipomatous neoplasm of other sites: Secondary | ICD-10-CM

## 2016-01-29 NOTE — Progress Notes (Signed)
    Victoria Wyatt 08-Feb-1941 [id number]        75 y.o.  [id number]  for breast and pelvic exam. Several issues noted below.  Past medical history,surgical history, problem list, medications, allergies, family history and social history were all reviewed and documented as reviewed in the EPIC chart.  ROS:  Performed with pertinent positives and negatives included in the history, assessment and plan.   Additional significant findings :  None   Exam: [name] assistant Filed Vitals:   01/29/16 0915  BP: 122/76  Height: 5\' 5"  (1.651 m)  Weight: 130 lb (58.968 kg)   General appearance:  Normal affect, orientation and appearance. Skin: Grossly normal HEENT: Without gross lesions.  No cervical or supraclavicular adenopathy. Thyroid normal.  Lungs:  Clear without wheezing, rales or rhonchi Cardiac: RR, without RMG Abdominal:  Soft, nontender, without masses, guarding, rebound, organomegaly or hernia Breasts:  Examined lying and sitting. Right without masses, retractions, discharge or axillary adenopathy. Left with soft 2 cm lipomatous mass 6, position to finger rest off areola. No overlying skin changes, nipple discharge or axillary adenopathy Physical Exam  Pulmonary/Chest:      Pelvic:  Ext/BUS/vagina with atrophic changes. Pap smear of cuff done  Adnexa without masses or tenderness   Anus and perineum normal   Rectovaginal normal sphincter tone without palpated masses or tenderness.    Assessment/Plan:  75 y.o. [id number] female for breast and pelvic exam.   1. Stage IB grade 2 endometrial carcinoma 2011. Status post TVH/BSO/lymph node dissection. Follow up radiation. Doing well without complaints. Exam NED. Pap smear of cuff today. 2. Osteopenia. DEXA 2016 at Dr. Eddie Dibbles office. Started on Fosamax.. Doing well with this. Continue to follow up with him in reference to this. 3. Mammography 07/2015. Continue with annual mammography when due. SBE monthly reviewed. Small lipoma left  breast stable over years observation with ultrasound workup in the past consistent with lipoma. Patient will continue to monitor and as long as it remains unchanged will follow. 4. History of mild rectocele. Exam today is normal. 5. Colonoscopy 2008 with reported repeat interval 10 years. 6. Health maintenance. No routine lab work done as this is done at her primary physician's office. Follow up 1 year, sooner as needed.   Anastasio Auerbach MD, 9:48 AM 01/29/2016

## 2016-01-29 NOTE — Addendum Note (Signed)
Addended by: Nelva Nay on: 01/29/2016 10:00 AM   Modules accepted: Orders

## 2016-01-29 NOTE — Patient Instructions (Signed)

## 2016-01-30 LAB — PAP IG W/ RFLX HPV ASCU

## 2016-02-11 DIAGNOSIS — D485 Neoplasm of uncertain behavior of skin: Secondary | ICD-10-CM | POA: Diagnosis not present

## 2016-02-11 DIAGNOSIS — C44329 Squamous cell carcinoma of skin of other parts of face: Secondary | ICD-10-CM | POA: Diagnosis not present

## 2016-02-11 DIAGNOSIS — Z85828 Personal history of other malignant neoplasm of skin: Secondary | ICD-10-CM | POA: Diagnosis not present

## 2016-03-31 DIAGNOSIS — C44321 Squamous cell carcinoma of skin of nose: Secondary | ICD-10-CM | POA: Diagnosis not present

## 2016-04-09 ENCOUNTER — Other Ambulatory Visit: Payer: Self-pay

## 2016-05-15 DIAGNOSIS — Z23 Encounter for immunization: Secondary | ICD-10-CM | POA: Diagnosis not present

## 2016-07-22 DIAGNOSIS — Z85828 Personal history of other malignant neoplasm of skin: Secondary | ICD-10-CM | POA: Diagnosis not present

## 2016-07-22 DIAGNOSIS — D485 Neoplasm of uncertain behavior of skin: Secondary | ICD-10-CM | POA: Diagnosis not present

## 2016-07-22 DIAGNOSIS — C44519 Basal cell carcinoma of skin of other part of trunk: Secondary | ICD-10-CM | POA: Diagnosis not present

## 2016-07-22 DIAGNOSIS — D0461 Carcinoma in situ of skin of right upper limb, including shoulder: Secondary | ICD-10-CM | POA: Diagnosis not present

## 2016-07-29 ENCOUNTER — Encounter: Payer: Self-pay | Admitting: Gynecology

## 2016-07-29 DIAGNOSIS — Z803 Family history of malignant neoplasm of breast: Secondary | ICD-10-CM | POA: Diagnosis not present

## 2016-07-29 DIAGNOSIS — Z1231 Encounter for screening mammogram for malignant neoplasm of breast: Secondary | ICD-10-CM | POA: Diagnosis not present

## 2016-08-05 DIAGNOSIS — C44519 Basal cell carcinoma of skin of other part of trunk: Secondary | ICD-10-CM | POA: Diagnosis not present

## 2016-08-05 DIAGNOSIS — Z85828 Personal history of other malignant neoplasm of skin: Secondary | ICD-10-CM | POA: Diagnosis not present

## 2016-10-02 DIAGNOSIS — J3089 Other allergic rhinitis: Secondary | ICD-10-CM | POA: Diagnosis not present

## 2016-10-02 DIAGNOSIS — J018 Other acute sinusitis: Secondary | ICD-10-CM | POA: Diagnosis not present

## 2016-10-05 DIAGNOSIS — J209 Acute bronchitis, unspecified: Secondary | ICD-10-CM | POA: Diagnosis not present

## 2016-10-05 DIAGNOSIS — J329 Chronic sinusitis, unspecified: Secondary | ICD-10-CM | POA: Diagnosis not present

## 2016-10-21 DIAGNOSIS — R7301 Impaired fasting glucose: Secondary | ICD-10-CM | POA: Diagnosis not present

## 2016-10-21 DIAGNOSIS — I10 Essential (primary) hypertension: Secondary | ICD-10-CM | POA: Diagnosis not present

## 2016-10-21 DIAGNOSIS — M899 Disorder of bone, unspecified: Secondary | ICD-10-CM | POA: Diagnosis not present

## 2016-10-21 DIAGNOSIS — Z86010 Personal history of colonic polyps: Secondary | ICD-10-CM | POA: Diagnosis not present

## 2016-10-21 DIAGNOSIS — Z8589 Personal history of malignant neoplasm of other organs and systems: Secondary | ICD-10-CM | POA: Diagnosis not present

## 2016-10-21 DIAGNOSIS — J309 Allergic rhinitis, unspecified: Secondary | ICD-10-CM | POA: Diagnosis not present

## 2016-10-21 DIAGNOSIS — Z8542 Personal history of malignant neoplasm of other parts of uterus: Secondary | ICD-10-CM | POA: Diagnosis not present

## 2016-10-21 DIAGNOSIS — M85852 Other specified disorders of bone density and structure, left thigh: Secondary | ICD-10-CM | POA: Diagnosis not present

## 2016-12-29 DIAGNOSIS — D3001 Benign neoplasm of right kidney: Secondary | ICD-10-CM | POA: Diagnosis not present

## 2017-01-12 DIAGNOSIS — Z961 Presence of intraocular lens: Secondary | ICD-10-CM | POA: Diagnosis not present

## 2017-01-12 DIAGNOSIS — H53001 Unspecified amblyopia, right eye: Secondary | ICD-10-CM | POA: Diagnosis not present

## 2017-01-12 DIAGNOSIS — H04123 Dry eye syndrome of bilateral lacrimal glands: Secondary | ICD-10-CM | POA: Diagnosis not present

## 2017-01-29 ENCOUNTER — Ambulatory Visit (INDEPENDENT_AMBULATORY_CARE_PROVIDER_SITE_OTHER): Payer: Medicare Other | Admitting: Gynecology

## 2017-01-29 ENCOUNTER — Encounter: Payer: Self-pay | Admitting: Gynecology

## 2017-01-29 VITALS — BP 140/86 | Ht 64.25 in | Wt 129.4 lb

## 2017-01-29 DIAGNOSIS — Z01411 Encounter for gynecological examination (general) (routine) with abnormal findings: Secondary | ICD-10-CM | POA: Diagnosis not present

## 2017-01-29 DIAGNOSIS — C541 Malignant neoplasm of endometrium: Secondary | ICD-10-CM

## 2017-01-29 DIAGNOSIS — Z1272 Encounter for screening for malignant neoplasm of vagina: Secondary | ICD-10-CM

## 2017-01-29 DIAGNOSIS — N952 Postmenopausal atrophic vaginitis: Secondary | ICD-10-CM

## 2017-01-29 DIAGNOSIS — M8580 Other specified disorders of bone density and structure, unspecified site: Secondary | ICD-10-CM

## 2017-01-29 NOTE — Patient Instructions (Signed)
Followup in one year for annual exam, sooner if any issues 

## 2017-01-29 NOTE — Progress Notes (Signed)
    Victoria Wyatt 1940/12/28 [id number]        76 y.o.  [name] for breast and pelvic exam  Past medical history,surgical history, problem list, medications, allergies, family history and social history were all reviewed and documented as reviewed in the EPIC chart.  ROS:  Performed with pertinent positives and negatives included in the history, assessment and plan.   Additional significant findings :  None   Exam: Copywriter, advertising Vitals:   01/29/17 0914  BP: 140/86  Weight: 129 lb 6.4 oz (58.7 kg)  Height: 5' 4.25" (1.632 m)   Body mass index is 22.04 kg/m.  General appearance:  Normal affect, orientation and appearance. Skin: Grossly normal HEENT: Without gross lesions.  No cervical or supraclavicular adenopathy. Thyroid normal.  Lungs:  Clear without wheezing, rales or rhonchi Cardiac: RR, without RMG Abdominal:  Soft, nontender, without masses, guarding, rebound, organomegaly or hernia Breasts:  Examined lying and sitting. Right without masses, retractions, discharge or axillary adenopathy.  left with 2 cm lipomatous mass 6:00 position off areola. No other masses, retractions, discharge or adenopathy Pelvic:  Ext, BUS, Vagina:  With atrophic changes. Pap smear of cuff done  Adnexa: Without masses or tenderness    Anus and perineum: Normal   Rectovaginal: Normal sphincter tone without palpated masses or tenderness.    Assessment/Plan:  76 y.o. [id number] female for breast and pelvic exam.   1. Stage IB grade 2 endometrial carcinoma 2011. Status post TAH, BSO, lymph node dissection. Follow up radiation. Doing well without complaints. Exam NED. Pap smear of cuff done. 2. Osteopenia. Followed by Dr. Rex Kras. Scheduled for DEXA later this year. Continuing on Fosamax per his follow up. 3. Mammography 2017. Continue with annual mammography when due. SBE monthly reviewed. Small lipomatous mass left breast stable times years. Patient will continue with self exam and follow up if any  changes 4. Colonoscopy due this year and patients going to arrange for this. 5. Health maintenance. No routine lab work done as patient reports this done elsewhere. Follow up 1 year, sooner as needed.   Anastasio Auerbach MD, 9:49 AM 01/29/2017

## 2017-02-02 LAB — PAP IG W/ RFLX HPV ASCU

## 2017-02-17 DIAGNOSIS — C44519 Basal cell carcinoma of skin of other part of trunk: Secondary | ICD-10-CM | POA: Diagnosis not present

## 2017-02-17 DIAGNOSIS — L821 Other seborrheic keratosis: Secondary | ICD-10-CM | POA: Diagnosis not present

## 2017-02-17 DIAGNOSIS — D225 Melanocytic nevi of trunk: Secondary | ICD-10-CM | POA: Diagnosis not present

## 2017-02-17 DIAGNOSIS — L820 Inflamed seborrheic keratosis: Secondary | ICD-10-CM | POA: Diagnosis not present

## 2017-02-17 DIAGNOSIS — L905 Scar conditions and fibrosis of skin: Secondary | ICD-10-CM | POA: Diagnosis not present

## 2017-02-17 DIAGNOSIS — Z85828 Personal history of other malignant neoplasm of skin: Secondary | ICD-10-CM | POA: Diagnosis not present

## 2017-02-17 DIAGNOSIS — D692 Other nonthrombocytopenic purpura: Secondary | ICD-10-CM | POA: Diagnosis not present

## 2017-02-17 DIAGNOSIS — L739 Follicular disorder, unspecified: Secondary | ICD-10-CM | POA: Diagnosis not present

## 2017-02-17 DIAGNOSIS — D2262 Melanocytic nevi of left upper limb, including shoulder: Secondary | ICD-10-CM | POA: Diagnosis not present

## 2017-02-17 DIAGNOSIS — L570 Actinic keratosis: Secondary | ICD-10-CM | POA: Diagnosis not present

## 2017-02-17 DIAGNOSIS — D485 Neoplasm of uncertain behavior of skin: Secondary | ICD-10-CM | POA: Diagnosis not present

## 2017-02-17 DIAGNOSIS — D1722 Benign lipomatous neoplasm of skin and subcutaneous tissue of left arm: Secondary | ICD-10-CM | POA: Diagnosis not present

## 2017-02-17 DIAGNOSIS — L720 Epidermal cyst: Secondary | ICD-10-CM | POA: Diagnosis not present

## 2017-02-17 DIAGNOSIS — D2272 Melanocytic nevi of left lower limb, including hip: Secondary | ICD-10-CM | POA: Diagnosis not present

## 2017-03-19 ENCOUNTER — Ambulatory Visit (INDEPENDENT_AMBULATORY_CARE_PROVIDER_SITE_OTHER): Payer: Medicare Other | Admitting: Gynecology

## 2017-03-19 ENCOUNTER — Encounter: Payer: Self-pay | Admitting: Gynecology

## 2017-03-19 VITALS — BP 116/76

## 2017-03-19 DIAGNOSIS — N3000 Acute cystitis without hematuria: Secondary | ICD-10-CM | POA: Diagnosis not present

## 2017-03-19 LAB — URINALYSIS W MICROSCOPIC + REFLEX CULTURE
BILIRUBIN URINE: NEGATIVE
CRYSTALS: NONE SEEN [HPF]
Casts: NONE SEEN [LPF]
GLUCOSE, UA: NEGATIVE
Ketones, ur: NEGATIVE
Nitrite: NEGATIVE
PROTEIN: NEGATIVE
Specific Gravity, Urine: <1.005 (ref 1.001–1.035)
Yeast: NONE SEEN [HPF]
pH: 6 (ref 5.0–8.0)

## 2017-03-19 MED ORDER — SULFAMETHOXAZOLE-TRIMETHOPRIM 800-160 MG PO TABS: 1.0000 | ORAL_TABLET | Freq: Two times a day (BID) | ORAL | 0 refills | Status: DC

## 2017-03-19 NOTE — Patient Instructions (Signed)
Take the Septra antibiotic twice daily for 3 days. Follow up if your symptoms persist, worsen or recur.

## 2017-03-19 NOTE — Addendum Note (Signed)
Addended by: Nelva Nay on: 03/19/2017 03:41 PM   Modules accepted: Orders

## 2017-03-19 NOTE — Progress Notes (Signed)
    Victoria Wyatt 01-01-1941 [id number]        76 y.o.  [id number] presents awakening this morning having dark urine although it seems to have cleared during the day. She now is developing mild dysuria. No urgency, frequency, low back pain or fever or chills. No nausea vomiting diarrhea constipation. No vaginal symptoms such as discharge, irritation/itching or odor.  Past medical history,surgical history, problem list, medications, allergies, family history and social history were all reviewed and documented in the EPIC chart.  Directed ROS with pertinent positives and negatives documented in the history of present illness/assessment and plan.  Exam: Vitals:   03/19/17 1438  BP: 116/76   General appearance:  Normal Spine straight without CVA tenderness Abdomen soft nontender without masses guarding rebound  Assessment/Plan:  76 y.o. [id number] with history and urinalysis consistent with early UTI. Will treat with Septra DS 1 by mouth twice a day 3 days. Follow up if symptoms persist, worsen or recur.    Anastasio Auerbach MD, 3:00 PM 03/19/2017

## 2017-03-20 LAB — URINE CULTURE: Organism ID, Bacteria: NO GROWTH

## 2017-03-31 DIAGNOSIS — L820 Inflamed seborrheic keratosis: Secondary | ICD-10-CM | POA: Diagnosis not present

## 2017-03-31 DIAGNOSIS — Z85828 Personal history of other malignant neoplasm of skin: Secondary | ICD-10-CM | POA: Diagnosis not present

## 2017-03-31 DIAGNOSIS — L821 Other seborrheic keratosis: Secondary | ICD-10-CM | POA: Diagnosis not present

## 2017-05-11 DIAGNOSIS — M899 Disorder of bone, unspecified: Secondary | ICD-10-CM | POA: Diagnosis not present

## 2017-05-11 DIAGNOSIS — Z23 Encounter for immunization: Secondary | ICD-10-CM | POA: Diagnosis not present

## 2017-05-11 DIAGNOSIS — J309 Allergic rhinitis, unspecified: Secondary | ICD-10-CM | POA: Diagnosis not present

## 2017-05-11 DIAGNOSIS — M85852 Other specified disorders of bone density and structure, left thigh: Secondary | ICD-10-CM | POA: Diagnosis not present

## 2017-05-11 DIAGNOSIS — R7301 Impaired fasting glucose: Secondary | ICD-10-CM | POA: Diagnosis not present

## 2017-05-11 DIAGNOSIS — Z8589 Personal history of malignant neoplasm of other organs and systems: Secondary | ICD-10-CM | POA: Diagnosis not present

## 2017-05-11 DIAGNOSIS — I10 Essential (primary) hypertension: Secondary | ICD-10-CM | POA: Diagnosis not present

## 2017-05-11 DIAGNOSIS — Z8542 Personal history of malignant neoplasm of other parts of uterus: Secondary | ICD-10-CM | POA: Diagnosis not present

## 2017-05-11 DIAGNOSIS — Z86010 Personal history of colonic polyps: Secondary | ICD-10-CM | POA: Diagnosis not present

## 2017-05-14 ENCOUNTER — Other Ambulatory Visit: Payer: Self-pay | Admitting: Gastroenterology

## 2017-05-14 DIAGNOSIS — R140 Abdominal distension (gaseous): Secondary | ICD-10-CM

## 2017-05-14 DIAGNOSIS — R197 Diarrhea, unspecified: Secondary | ICD-10-CM | POA: Diagnosis not present

## 2017-05-14 DIAGNOSIS — R109 Unspecified abdominal pain: Secondary | ICD-10-CM

## 2017-05-14 DIAGNOSIS — R194 Change in bowel habit: Secondary | ICD-10-CM | POA: Diagnosis not present

## 2017-05-21 ENCOUNTER — Ambulatory Visit
Admission: RE | Admit: 2017-05-21 | Discharge: 2017-05-21 | Disposition: A | Discharge status: Home / Self Care | Payer: Medicare Other | Source: Ambulatory Visit | Attending: Gastroenterology | Admitting: Gastroenterology

## 2017-05-21 DIAGNOSIS — R140 Abdominal distension (gaseous): Secondary | ICD-10-CM | POA: Diagnosis not present

## 2017-05-21 DIAGNOSIS — D1771 Benign lipomatous neoplasm of kidney: Secondary | ICD-10-CM | POA: Diagnosis not present

## 2017-05-21 DIAGNOSIS — R109 Unspecified abdominal pain: Secondary | ICD-10-CM

## 2017-06-16 DIAGNOSIS — L821 Other seborrheic keratosis: Secondary | ICD-10-CM | POA: Diagnosis not present

## 2017-06-16 DIAGNOSIS — L820 Inflamed seborrheic keratosis: Secondary | ICD-10-CM | POA: Diagnosis not present

## 2017-06-16 DIAGNOSIS — L720 Epidermal cyst: Secondary | ICD-10-CM | POA: Diagnosis not present

## 2017-06-16 DIAGNOSIS — L910 Hypertrophic scar: Secondary | ICD-10-CM | POA: Diagnosis not present

## 2017-06-16 DIAGNOSIS — Z85828 Personal history of other malignant neoplasm of skin: Secondary | ICD-10-CM | POA: Diagnosis not present

## 2017-10-19 DIAGNOSIS — Z1231 Encounter for screening mammogram for malignant neoplasm of breast: Secondary | ICD-10-CM | POA: Diagnosis not present

## 2017-10-19 DIAGNOSIS — M8589 Other specified disorders of bone density and structure, multiple sites: Secondary | ICD-10-CM | POA: Diagnosis not present

## 2017-10-22 ENCOUNTER — Encounter: Payer: Self-pay | Admitting: Gynecology

## 2017-10-22 DIAGNOSIS — M85852 Other specified disorders of bone density and structure, left thigh: Secondary | ICD-10-CM | POA: Diagnosis not present

## 2017-10-22 DIAGNOSIS — E7800 Pure hypercholesterolemia, unspecified: Secondary | ICD-10-CM | POA: Diagnosis not present

## 2017-10-22 DIAGNOSIS — Z8542 Personal history of malignant neoplasm of other parts of uterus: Secondary | ICD-10-CM | POA: Diagnosis not present

## 2017-10-22 DIAGNOSIS — I10 Essential (primary) hypertension: Secondary | ICD-10-CM | POA: Diagnosis not present

## 2017-10-22 DIAGNOSIS — Z8589 Personal history of malignant neoplasm of other organs and systems: Secondary | ICD-10-CM | POA: Diagnosis not present

## 2017-10-22 DIAGNOSIS — R7301 Impaired fasting glucose: Secondary | ICD-10-CM | POA: Diagnosis not present

## 2017-10-22 DIAGNOSIS — Z23 Encounter for immunization: Secondary | ICD-10-CM | POA: Diagnosis not present

## 2017-10-22 DIAGNOSIS — Z86010 Personal history of colonic polyps: Secondary | ICD-10-CM | POA: Diagnosis not present

## 2017-10-25 ENCOUNTER — Encounter: Payer: Self-pay | Admitting: Gynecology

## 2018-01-21 DIAGNOSIS — Z961 Presence of intraocular lens: Secondary | ICD-10-CM | POA: Diagnosis not present

## 2018-01-21 DIAGNOSIS — H353131 Nonexudative age-related macular degeneration, bilateral, early dry stage: Secondary | ICD-10-CM | POA: Diagnosis not present

## 2018-01-21 DIAGNOSIS — H53001 Unspecified amblyopia, right eye: Secondary | ICD-10-CM | POA: Diagnosis not present

## 2018-01-21 DIAGNOSIS — H04123 Dry eye syndrome of bilateral lacrimal glands: Secondary | ICD-10-CM | POA: Diagnosis not present

## 2018-01-31 ENCOUNTER — Encounter: Payer: Medicare Other | Admitting: Gynecology

## 2018-02-04 ENCOUNTER — Encounter: Payer: Self-pay | Admitting: Gynecology

## 2018-02-04 ENCOUNTER — Ambulatory Visit (INDEPENDENT_AMBULATORY_CARE_PROVIDER_SITE_OTHER): Payer: Medicare Other | Admitting: Gynecology

## 2018-02-04 VITALS — BP 118/76 | Ht 64.0 in | Wt 132.0 lb

## 2018-02-04 DIAGNOSIS — Z8542 Personal history of malignant neoplasm of other parts of uterus: Secondary | ICD-10-CM

## 2018-02-04 DIAGNOSIS — M8580 Other specified disorders of bone density and structure, unspecified site: Secondary | ICD-10-CM

## 2018-02-04 DIAGNOSIS — Z9189 Other specified personal risk factors, not elsewhere classified: Secondary | ICD-10-CM | POA: Diagnosis not present

## 2018-02-04 DIAGNOSIS — C541 Malignant neoplasm of endometrium: Secondary | ICD-10-CM

## 2018-02-04 DIAGNOSIS — Z01419 Encounter for gynecological examination (general) (routine) without abnormal findings: Secondary | ICD-10-CM

## 2018-02-04 DIAGNOSIS — N952 Postmenopausal atrophic vaginitis: Secondary | ICD-10-CM

## 2018-02-04 NOTE — Progress Notes (Addendum)
    Victoria Wyatt 1940-08-06 [id number]        77 y.o.  [id number] for breast and pelvic exam.  Past medical history,surgical history, problem list, medications, allergies, family history and social history were all reviewed and documented as reviewed in the EPIC chart.  ROS:  Performed with pertinent positives and negatives included in the history, assessment and plan.   Additional significant findings : None   Exam: [name] assistant Vitals:   02/04/18 1328  BP: 118/76  Weight: 132 lb (59.9 kg)  Height: 5\' 4"  (1.626 m)   Body mass index is 22.66 kg/m.  General appearance:  Normal affect, orientation and appearance. Skin: Grossly normal HEENT: Without gross lesions.  No cervical or supraclavicular adenopathy. Thyroid normal.  Lungs:  Clear without wheezing, rales or rhonchi Cardiac: RR, without RMG Abdominal:  Soft, nontender, without masses, guarding, rebound, organomegaly or hernia Breasts:  Examined lying and sitting.  Right without masses, retractions, discharge or axillary adenopathy.  Left with 2 cm lipomatous mass 6 o'clock position off of areola.  No other masses, retractions, discharge or adenopathy Pelvic:  Ext, BUS, Vagina: With atrophic changes  Adnexa: Without masses or tenderness    Anus and perineum: Normal   Rectovaginal: Normal sphincter tone without palpated masses or tenderness.    Assessment/Plan:  77 y.o. [id number] female for breast and pelvic exam.   1. Postmenopausal/atrophic genital changes.  No menopausal symptoms. 2. Stage Ib grade 2 endometrial carcinoma 2011.  Status post TAH, BSO, lymph node dissection.  Follow-up radiation.  Doing well without complaints.  Exam NED.  No Pap smear done as most current gynecologic oncology recommendations do not recommend Pap smear screening for endometrial cancer history. 3. Pap smear 2018.  No Pap smear done today.  No history of significant abnormal Pap smears.  Per current screening guidelines will stop  screening based on age and patient is comfortable with this. 4. Mammography 10/2017.  Small lipomatous mass left breast present for years remains unchanged.  Patient will continue with self breast exams and as long as does not change them will follow.  Schedule in follow-up for mammography now.  5. Osteopenia.  DEXA 2019 T score -1.9.  Currently on Fosamax per Dr. Rex Kras and will continue to follow-up with him in reference to bone health.  She reports that he plans on stopping her Fosamax next year.  Plan repeat DEXA next year at 2-year interval. 6. Colonoscopy 2008.  Patient will follow-up with her primary as far as recommendations regarding colon screening. 7. Health maintenance.  No routine lab work done as patient does this elsewhere.  Follow-up 1 year, sooner as needed.   Anastasio Auerbach MD, 2:09 PM 02/04/2018

## 2018-02-04 NOTE — Patient Instructions (Signed)
Follow-up in 1 year for annual exam, sooner as needed. 

## 2018-03-16 DIAGNOSIS — D2272 Melanocytic nevi of left lower limb, including hip: Secondary | ICD-10-CM | POA: Diagnosis not present

## 2018-03-16 DIAGNOSIS — D2261 Melanocytic nevi of right upper limb, including shoulder: Secondary | ICD-10-CM | POA: Diagnosis not present

## 2018-03-16 DIAGNOSIS — L570 Actinic keratosis: Secondary | ICD-10-CM | POA: Diagnosis not present

## 2018-03-16 DIAGNOSIS — L820 Inflamed seborrheic keratosis: Secondary | ICD-10-CM | POA: Diagnosis not present

## 2018-03-16 DIAGNOSIS — L821 Other seborrheic keratosis: Secondary | ICD-10-CM | POA: Diagnosis not present

## 2018-03-16 DIAGNOSIS — Z85828 Personal history of other malignant neoplasm of skin: Secondary | ICD-10-CM | POA: Diagnosis not present

## 2018-03-16 DIAGNOSIS — D1721 Benign lipomatous neoplasm of skin and subcutaneous tissue of right arm: Secondary | ICD-10-CM | POA: Diagnosis not present

## 2018-03-16 DIAGNOSIS — D1801 Hemangioma of skin and subcutaneous tissue: Secondary | ICD-10-CM | POA: Diagnosis not present

## 2018-03-16 DIAGNOSIS — D1722 Benign lipomatous neoplasm of skin and subcutaneous tissue of left arm: Secondary | ICD-10-CM | POA: Diagnosis not present

## 2018-03-16 DIAGNOSIS — D2262 Melanocytic nevi of left upper limb, including shoulder: Secondary | ICD-10-CM | POA: Diagnosis not present

## 2018-04-22 DIAGNOSIS — E7800 Pure hypercholesterolemia, unspecified: Secondary | ICD-10-CM | POA: Diagnosis not present

## 2018-04-22 DIAGNOSIS — M85852 Other specified disorders of bone density and structure, left thigh: Secondary | ICD-10-CM | POA: Diagnosis not present

## 2018-04-22 DIAGNOSIS — Z8542 Personal history of malignant neoplasm of other parts of uterus: Secondary | ICD-10-CM | POA: Diagnosis not present

## 2018-04-22 DIAGNOSIS — Z8589 Personal history of malignant neoplasm of other organs and systems: Secondary | ICD-10-CM | POA: Diagnosis not present

## 2018-04-22 DIAGNOSIS — I10 Essential (primary) hypertension: Secondary | ICD-10-CM | POA: Diagnosis not present

## 2018-04-22 DIAGNOSIS — R7301 Impaired fasting glucose: Secondary | ICD-10-CM | POA: Diagnosis not present

## 2018-04-22 DIAGNOSIS — Z23 Encounter for immunization: Secondary | ICD-10-CM | POA: Diagnosis not present

## 2018-04-22 DIAGNOSIS — Z86010 Personal history of colonic polyps: Secondary | ICD-10-CM | POA: Diagnosis not present

## 2018-07-09 IMAGING — US US ABDOMEN COMPLETE
1 series · 14 of 25 positions shown · non-contrast
Comparison: CT scan of April 19, 2013.

CLINICAL DATA: Acute generalized abdominal pain.

EXAM:
ABDOMEN ULTRASOUND COMPLETE

[Series 1: us abdomen complete · 0.17mm/px · 14 of 83 slices shown]
[im 1/83]
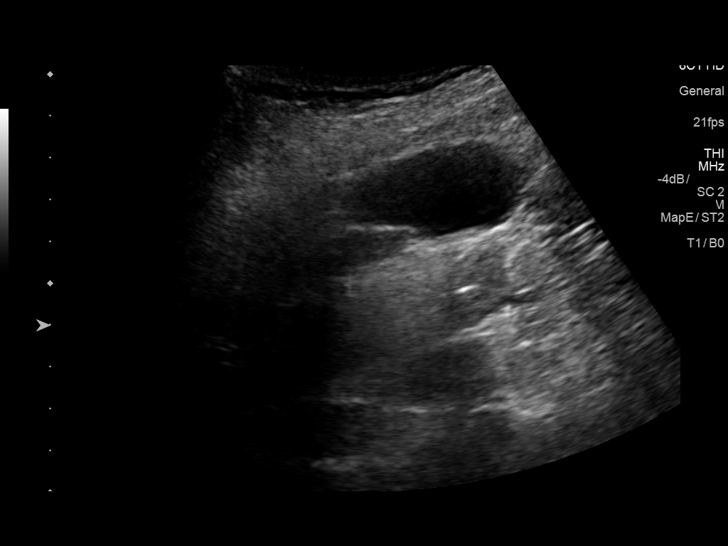
[im 7/83]
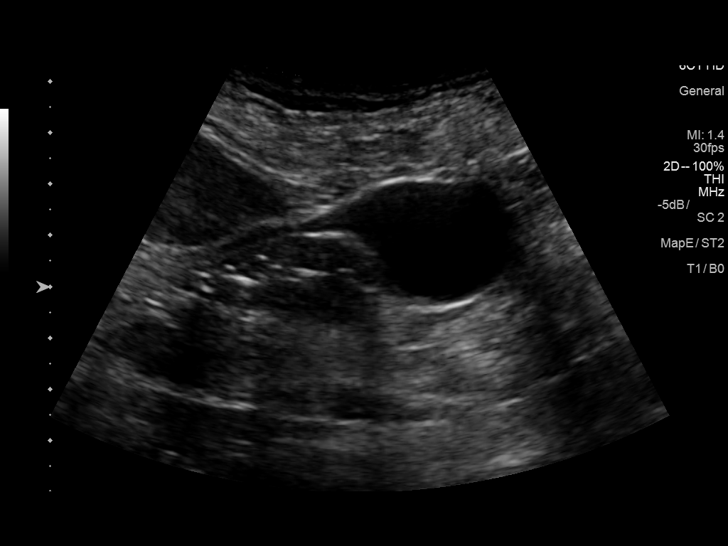
[im 14/83]
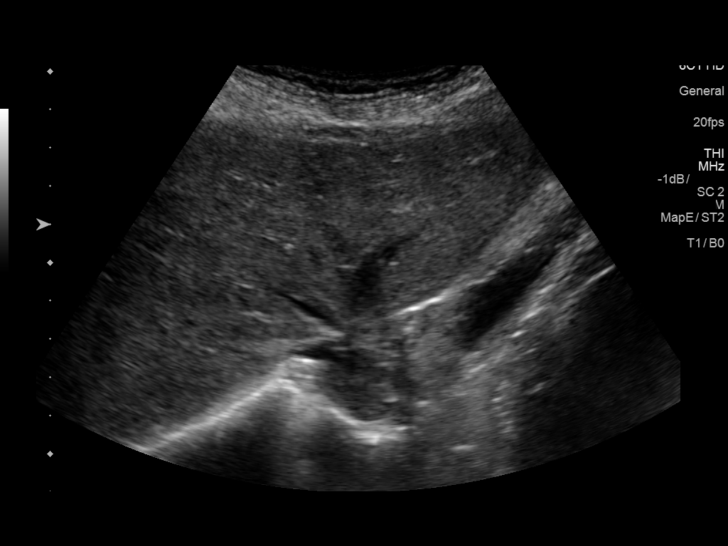
[im 21/83]
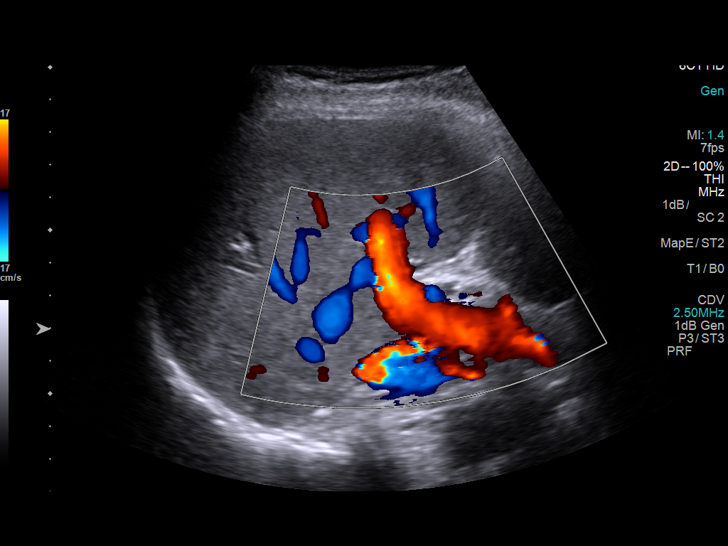
[im 28/83]
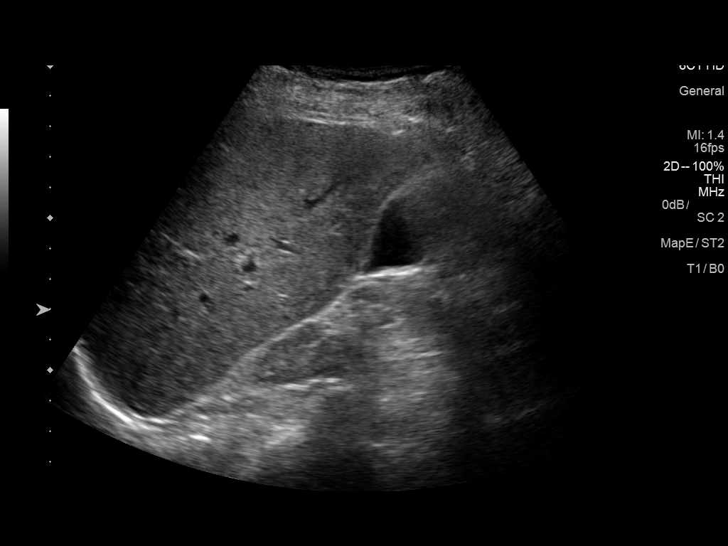
[im 31/83]
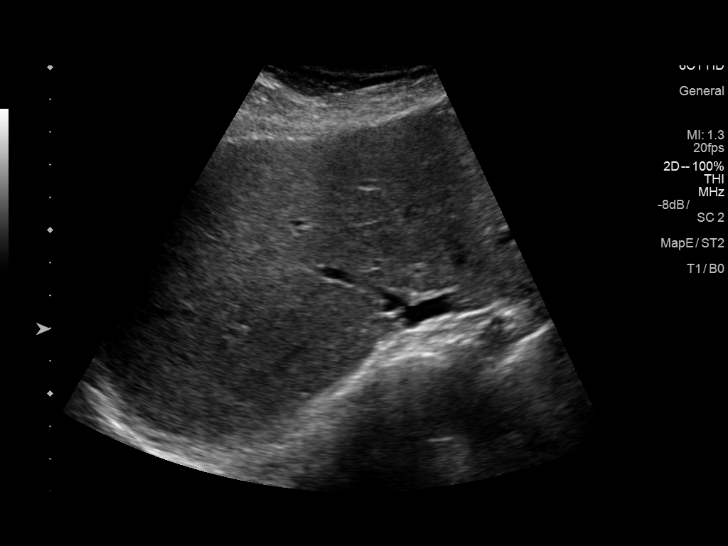
[im 38/83]
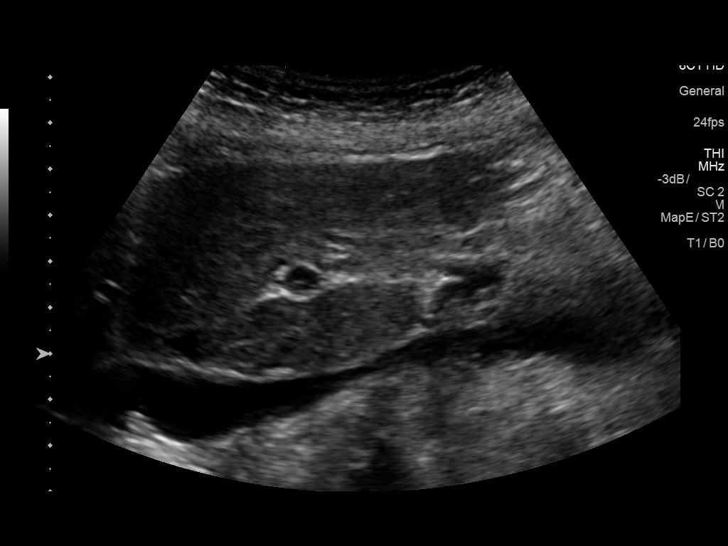
[im 45/83]
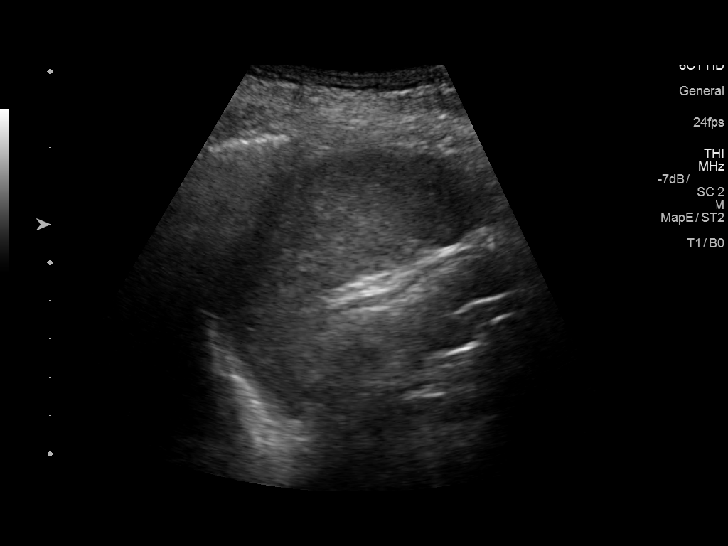
[im 52/83]
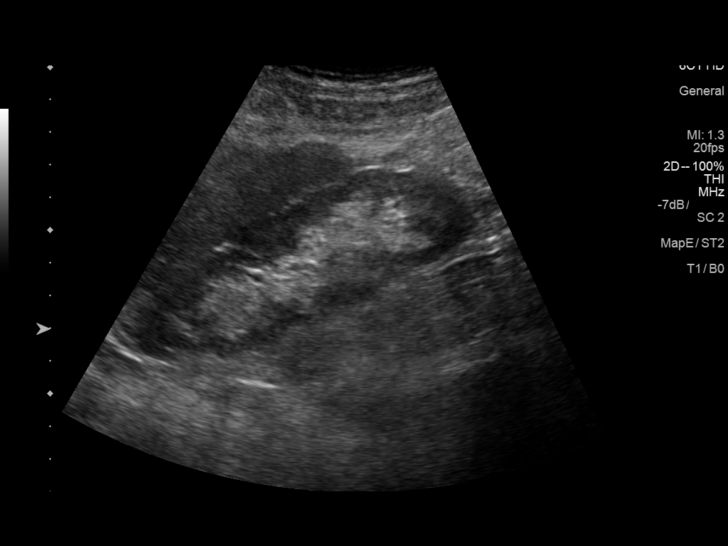
[im 55/83]
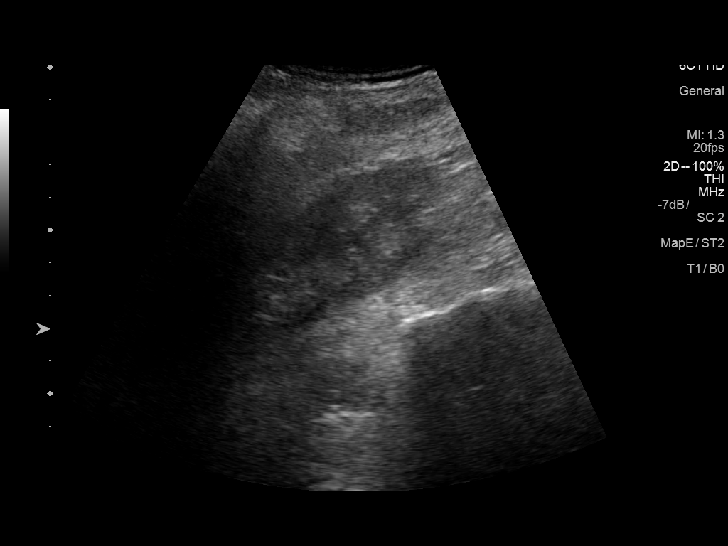
[im 62/83]
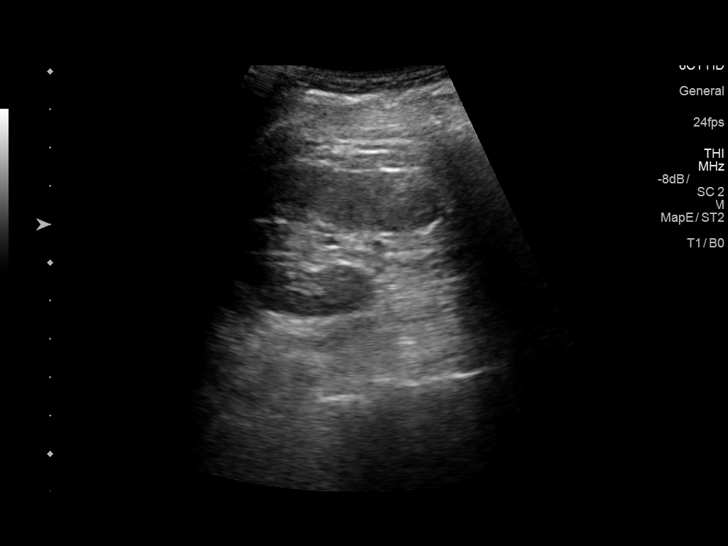
[im 69/83]
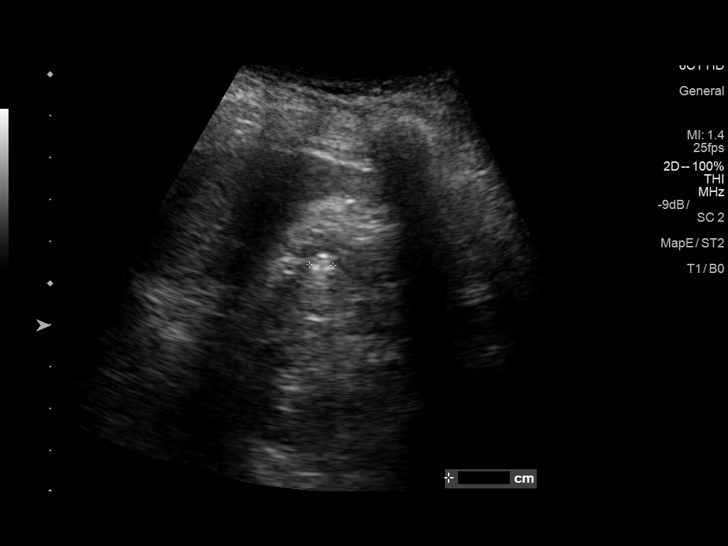
[im 76/83]
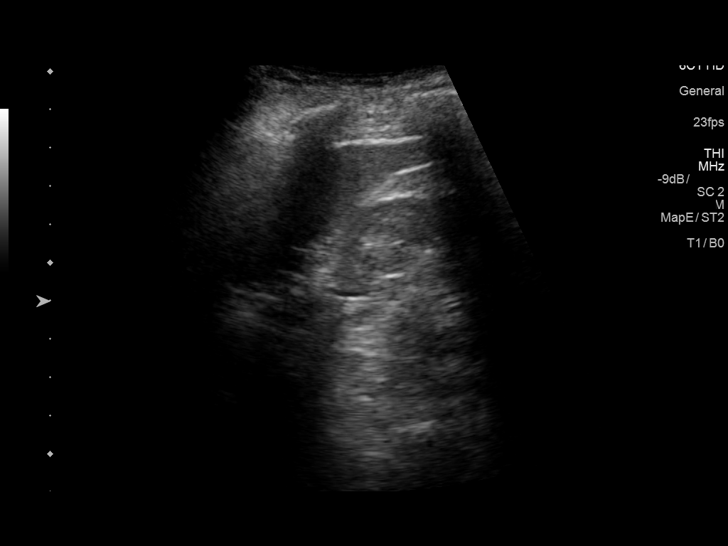
[im 83/83]
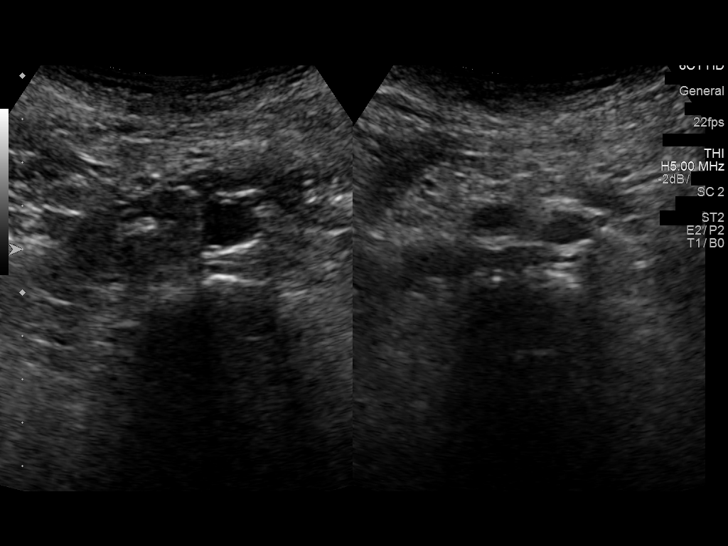

[14 of 25 positions shown; findings below may reference images not displayed]

FINDINGS: Gallbladder: No gallstones or wall thickening visualized. No
sonographic Murphy sign noted by sonographer.

Common bile duct: Diameter: 5.4 mm which is within normal limits.

Liver: No focal lesion identified. Within normal limits in
parenchymal echogenicity. Portal vein is patent on color Doppler
imaging with normal direction of blood flow towards the liver.

IVC: No abnormality visualized.

Pancreas: Visualized portion unremarkable.

Spleen: Size and appearance within normal limits.

Right Kidney: Length: 11.2 cm. Echogenicity within normal limits. No
mass or hydronephrosis visualized.

Left Kidney: Length: 11.4 cm. Echogenicity within normal limits. No
hydronephrosis visualized. 6 mm hyperechoic focus is noted in cortex
most consistent with renal angiomyolipoma.

Abdominal aorta: Atherosclerosis of abdominal aorta is noted without
aneurysm formation.

Other findings: None.
IMPRESSION: Probable 6 mm left renal angiomyolipoma is noted.

Aortic atherosclerosis.

No other abnormality seen in the abdomen.

## 2018-11-02 DIAGNOSIS — R7301 Impaired fasting glucose: Secondary | ICD-10-CM | POA: Diagnosis not present

## 2018-11-02 DIAGNOSIS — I10 Essential (primary) hypertension: Secondary | ICD-10-CM | POA: Diagnosis not present

## 2018-11-02 DIAGNOSIS — M85852 Other specified disorders of bone density and structure, left thigh: Secondary | ICD-10-CM | POA: Diagnosis not present

## 2018-11-02 DIAGNOSIS — Z0000 Encounter for general adult medical examination without abnormal findings: Secondary | ICD-10-CM | POA: Diagnosis not present

## 2018-11-02 DIAGNOSIS — Z8589 Personal history of malignant neoplasm of other organs and systems: Secondary | ICD-10-CM | POA: Diagnosis not present

## 2018-11-02 DIAGNOSIS — E7800 Pure hypercholesterolemia, unspecified: Secondary | ICD-10-CM | POA: Diagnosis not present

## 2018-11-02 DIAGNOSIS — Z8542 Personal history of malignant neoplasm of other parts of uterus: Secondary | ICD-10-CM | POA: Diagnosis not present

## 2019-01-02 ENCOUNTER — Encounter: Payer: Self-pay | Admitting: Gynecology

## 2019-01-02 DIAGNOSIS — Z1231 Encounter for screening mammogram for malignant neoplasm of breast: Secondary | ICD-10-CM | POA: Diagnosis not present

## 2019-01-02 DIAGNOSIS — Z803 Family history of malignant neoplasm of breast: Secondary | ICD-10-CM | POA: Diagnosis not present

## 2019-01-23 DIAGNOSIS — H353131 Nonexudative age-related macular degeneration, bilateral, early dry stage: Secondary | ICD-10-CM | POA: Diagnosis not present

## 2019-01-23 DIAGNOSIS — Z961 Presence of intraocular lens: Secondary | ICD-10-CM | POA: Diagnosis not present

## 2019-01-23 DIAGNOSIS — H53031 Strabismic amblyopia, right eye: Secondary | ICD-10-CM | POA: Diagnosis not present

## 2019-01-23 DIAGNOSIS — H04123 Dry eye syndrome of bilateral lacrimal glands: Secondary | ICD-10-CM | POA: Diagnosis not present

## 2019-02-07 ENCOUNTER — Other Ambulatory Visit: Payer: Self-pay

## 2019-02-08 ENCOUNTER — Ambulatory Visit (INDEPENDENT_AMBULATORY_CARE_PROVIDER_SITE_OTHER): Payer: Medicare Other | Admitting: Gynecology

## 2019-02-08 ENCOUNTER — Encounter: Payer: Self-pay | Admitting: Gynecology

## 2019-02-08 VITALS — BP 120/74 | Ht 64.0 in | Wt 126.0 lb

## 2019-02-08 DIAGNOSIS — N952 Postmenopausal atrophic vaginitis: Secondary | ICD-10-CM

## 2019-02-08 DIAGNOSIS — Z01419 Encounter for gynecological examination (general) (routine) without abnormal findings: Secondary | ICD-10-CM | POA: Diagnosis not present

## 2019-02-08 DIAGNOSIS — D171 Benign lipomatous neoplasm of skin and subcutaneous tissue of trunk: Secondary | ICD-10-CM

## 2019-02-08 DIAGNOSIS — Z9289 Personal history of other medical treatment: Secondary | ICD-10-CM | POA: Diagnosis not present

## 2019-02-08 DIAGNOSIS — M8580 Other specified disorders of bone density and structure, unspecified site: Secondary | ICD-10-CM

## 2019-02-08 DIAGNOSIS — Z9189 Other specified personal risk factors, not elsewhere classified: Secondary | ICD-10-CM

## 2019-02-08 DIAGNOSIS — Z8542 Personal history of malignant neoplasm of other parts of uterus: Secondary | ICD-10-CM

## 2019-02-08 NOTE — Patient Instructions (Signed)
Follow-up in 1 year for annual exam, sooner if any issues. 

## 2019-02-08 NOTE — Progress Notes (Signed)
    Victoria Wyatt 1941/02/11 [id number]        78 y.o.  [id number] for breast and pelvic exam.  Without gynecologic complaints  Past medical history,surgical history, problem list, medications, allergies, family history and social history were all reviewed and documented as reviewed in the EPIC chart.  ROS:  Performed with pertinent positives and negatives included in the history, assessment and plan.   Additional significant findings : None   Exam: [name] assistant Vitals:   02/08/19 1016  BP: 120/74  Weight: 126 lb (57.2 kg)  Height: 5\' 4"  (1.626 m)   Body mass index is 21.63 kg/m.  General appearance:  Normal affect, orientation and appearance. Skin: Grossly normal HEENT: Without gross lesions.  No cervical or supraclavicular adenopathy. Thyroid normal.  Lungs:  Clear without wheezing, rales or rhonchi Cardiac: RR, without RMG Abdominal:  Soft, nontender, without masses, guarding, rebound, organomegaly or hernia Breasts:  Examined lying and sitting.  Right without masses, retractions, discharge or axillary adenopathy.  Left with 2 cm lipomatous well-circumscribed mass 6 o'clock position several fingerbreadths off the areola. Pelvic:  Ext, BUS, Vagina: With atrophic changes  Adnexa: Without masses or tenderness    Anus and perineum: Normal   Rectovaginal: Normal sphincter tone without palpated masses or tenderness.    Assessment/Plan:  78 y.o. [name] female for breast and pelvic exam  1. Postmenopausal.  Without significant menopausal symptoms. 2. Stage Ib grade 2 endometrial carcinoma 2011.  Status post TAH/BSO with lymph node dissection with follow-up radiation.  Exam NED. 3. Pap smear 2018.  No Pap smear done today.  No history of significant abnormal Pap smears.  We both agree to stop screening per current screening guidelines. 4. Mammography 01/2019.  Small lipoma left breast present for years unchanged.  Continue with self breast exams and report any changes.   Follow-up mammogram in 1 year. 5. Osteopenia.  DEXA 2019 T score -1.9 currently on Fosamax by Dr. Rex Kras.  Recommend follow-up DEXA next year at 2-year interval. 6. Colonoscopy 2008.  Follow-up with primary physician for colon screening recommendations. 7. Health maintenance.  No routine lab work done as patient does this elsewhere.  Follow-up 1 year, sooner as needed.   Anastasio Auerbach MD, 10:34 AM 02/08/2019

## 2019-02-15 DIAGNOSIS — L738 Other specified follicular disorders: Secondary | ICD-10-CM | POA: Diagnosis not present

## 2019-02-15 DIAGNOSIS — C44529 Squamous cell carcinoma of skin of other part of trunk: Secondary | ICD-10-CM | POA: Diagnosis not present

## 2019-02-15 DIAGNOSIS — D485 Neoplasm of uncertain behavior of skin: Secondary | ICD-10-CM | POA: Diagnosis not present

## 2019-02-15 DIAGNOSIS — Z85828 Personal history of other malignant neoplasm of skin: Secondary | ICD-10-CM | POA: Diagnosis not present

## 2019-02-15 DIAGNOSIS — L820 Inflamed seborrheic keratosis: Secondary | ICD-10-CM | POA: Diagnosis not present

## 2019-03-07 IMAGING — US US PELVIS COMPLETE TRANSABD/TRANSVAG
1 series · 14 of 25 positions shown · non-contrast
Comparison: None

CLINICAL DATA: Bloating.  Previous hysterectomy.



[Series 1: us pelvis complete transabd/transvag · 0.22mm/px · 14 of 29 slices shown]
[im 1/29]
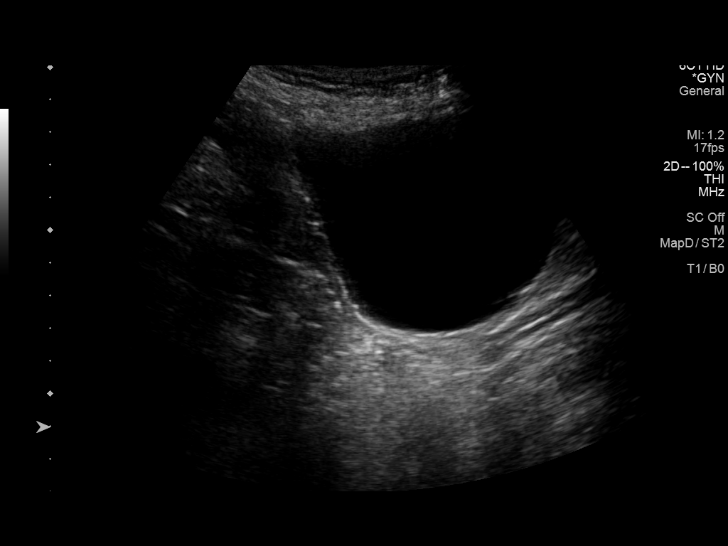
[im 3/29]
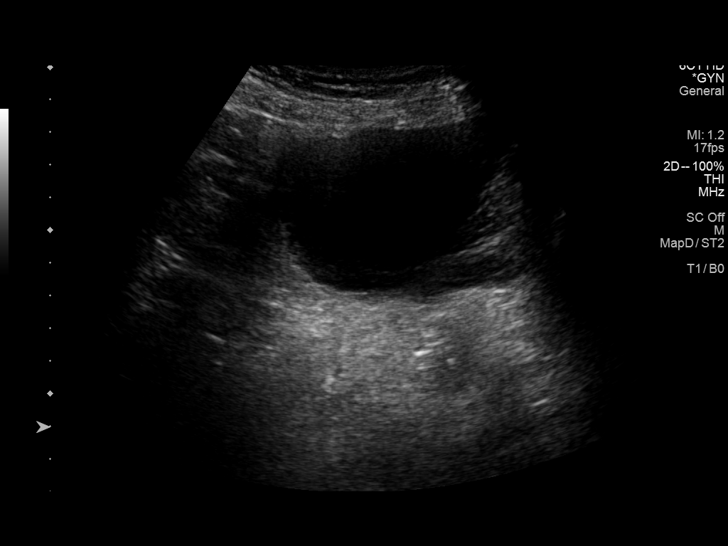
[im 5/29]
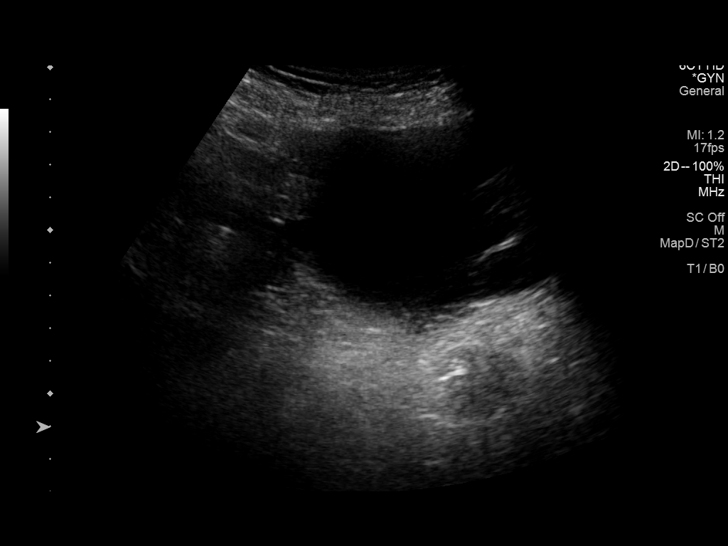
[im 8/29]
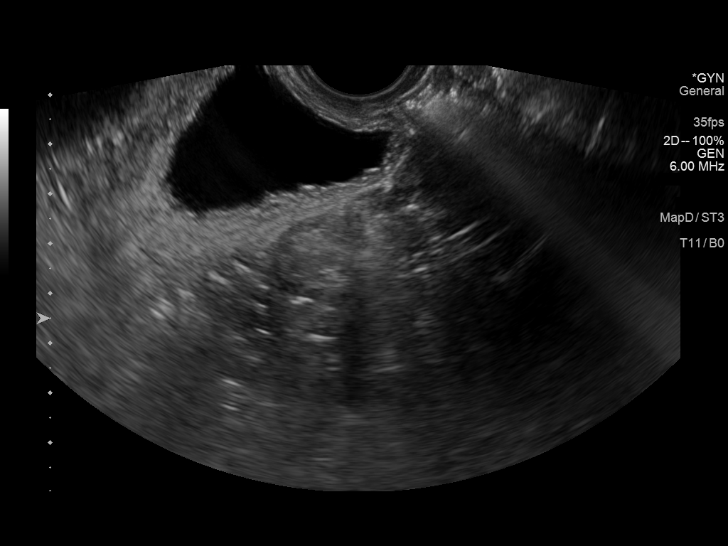
[im 10/29]
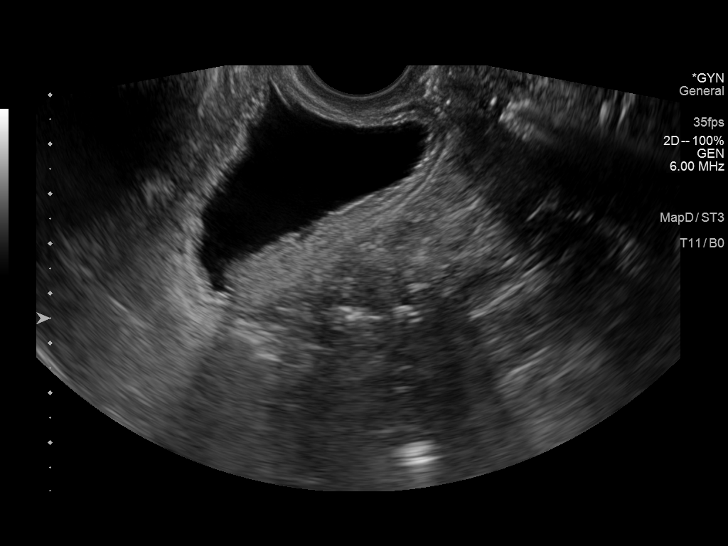
[im 11/29]
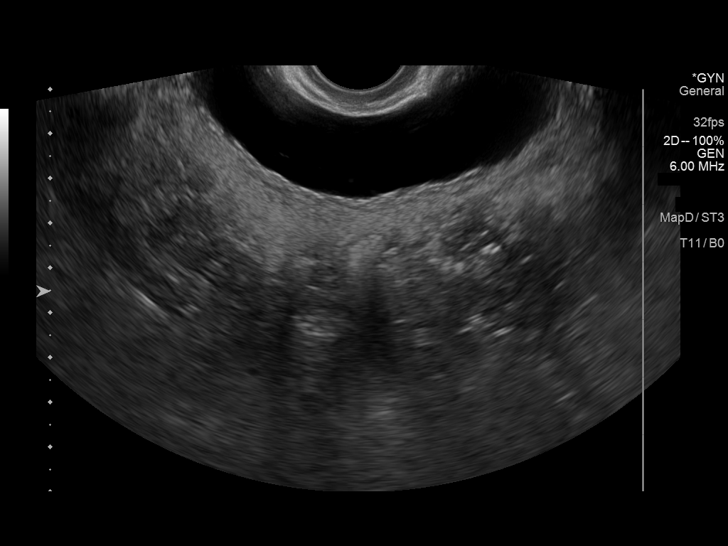
[im 13/29]
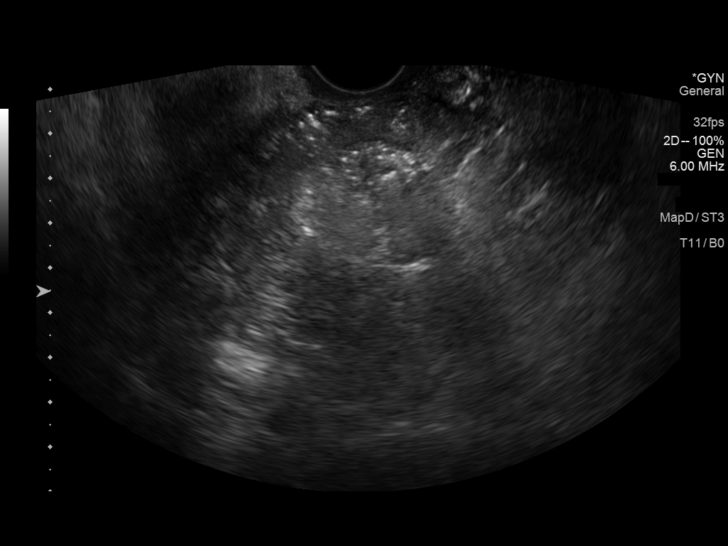
[im 16/29]
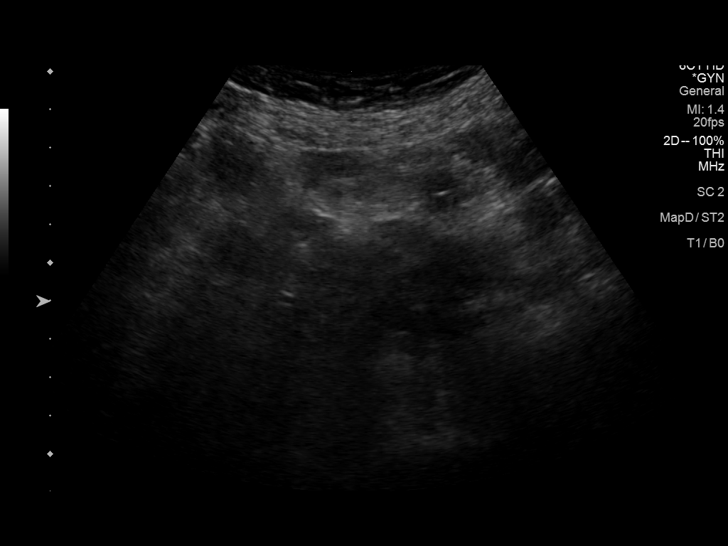
[im 18/29]
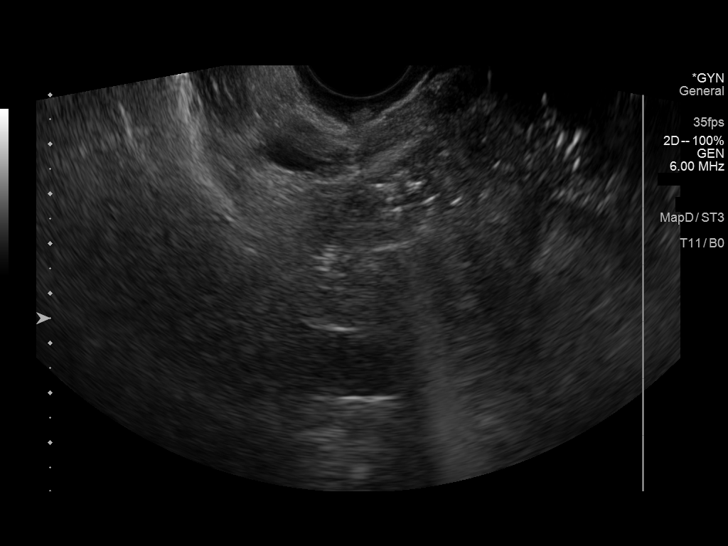
[im 19/29]
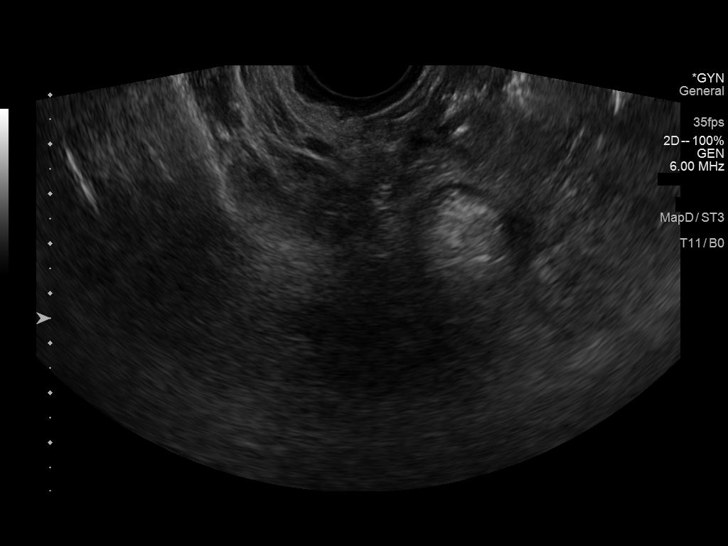
[im 22/29]
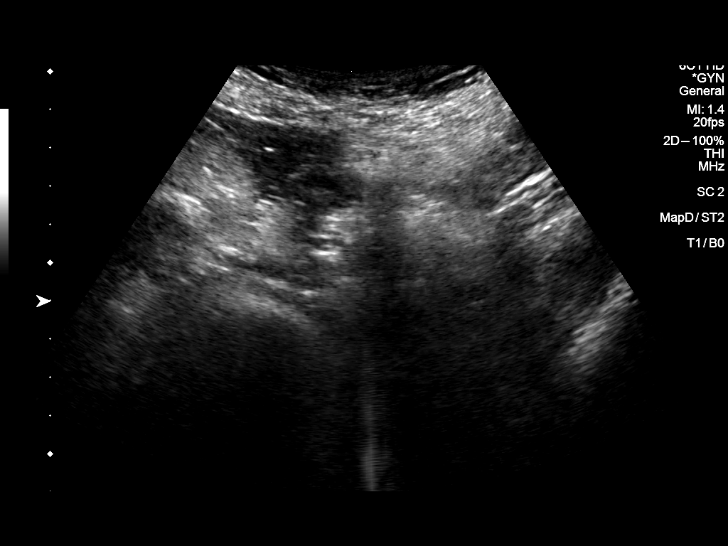
[im 24/29]
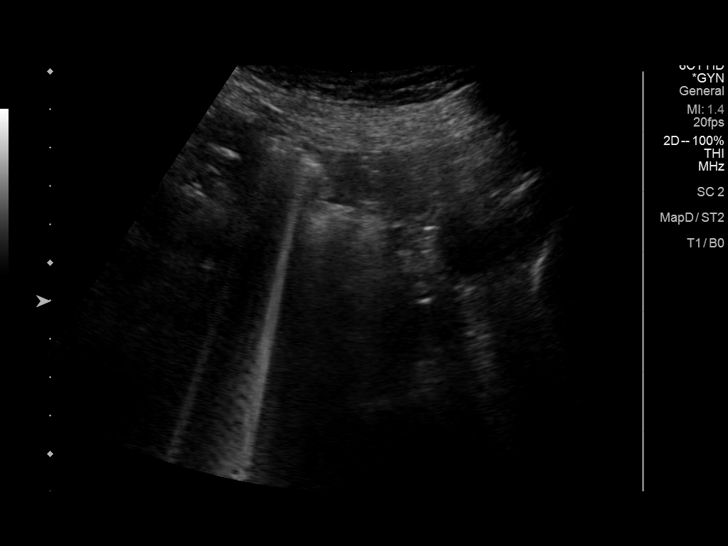
[im 26/29]
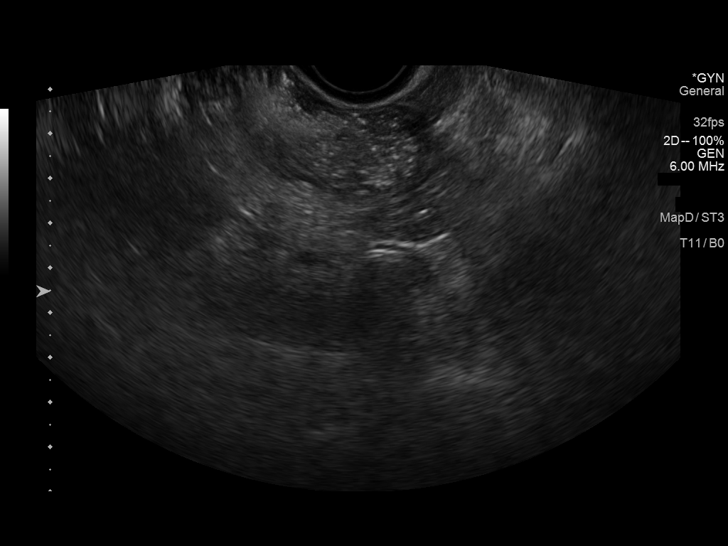
[im 29/29]
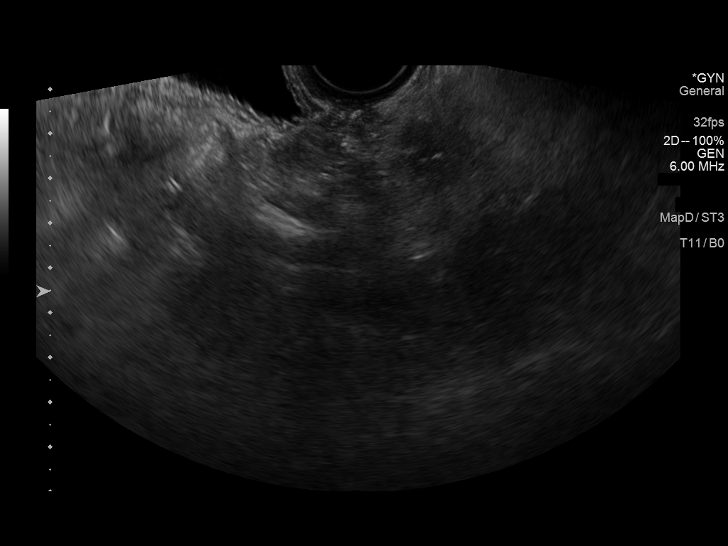

[14 of 25 positions shown; findings below may reference images not displayed]

FINDINGS: Uterus

Surgically removed.

Right ovary

Surgically removed.

Left ovary

Surgically removed.

Other findings

No masses identified.  No acute abnormalities.
IMPRESSION: The patient is status post hysterectomy. The ovaries have by report
been removed also. No cause for the patient's symptoms identified.

## 2019-03-10 DIAGNOSIS — R7301 Impaired fasting glucose: Secondary | ICD-10-CM | POA: Diagnosis not present

## 2019-04-27 DIAGNOSIS — Z23 Encounter for immunization: Secondary | ICD-10-CM | POA: Diagnosis not present

## 2019-04-28 DIAGNOSIS — D1801 Hemangioma of skin and subcutaneous tissue: Secondary | ICD-10-CM | POA: Diagnosis not present

## 2019-04-28 DIAGNOSIS — Z85828 Personal history of other malignant neoplasm of skin: Secondary | ICD-10-CM | POA: Diagnosis not present

## 2019-04-28 DIAGNOSIS — L820 Inflamed seborrheic keratosis: Secondary | ICD-10-CM | POA: Diagnosis not present

## 2019-04-28 DIAGNOSIS — L821 Other seborrheic keratosis: Secondary | ICD-10-CM | POA: Diagnosis not present

## 2019-04-28 DIAGNOSIS — D485 Neoplasm of uncertain behavior of skin: Secondary | ICD-10-CM | POA: Diagnosis not present

## 2019-05-11 ENCOUNTER — Encounter: Payer: Self-pay | Admitting: Gynecology

## 2019-09-07 DIAGNOSIS — Z85828 Personal history of other malignant neoplasm of skin: Secondary | ICD-10-CM | POA: Diagnosis not present

## 2019-09-07 DIAGNOSIS — L910 Hypertrophic scar: Secondary | ICD-10-CM | POA: Diagnosis not present

## 2019-09-07 DIAGNOSIS — D1801 Hemangioma of skin and subcutaneous tissue: Secondary | ICD-10-CM | POA: Diagnosis not present

## 2019-09-07 DIAGNOSIS — D485 Neoplasm of uncertain behavior of skin: Secondary | ICD-10-CM | POA: Diagnosis not present

## 2019-09-07 DIAGNOSIS — L570 Actinic keratosis: Secondary | ICD-10-CM | POA: Diagnosis not present

## 2019-11-15 DIAGNOSIS — D485 Neoplasm of uncertain behavior of skin: Secondary | ICD-10-CM | POA: Diagnosis not present

## 2019-11-15 DIAGNOSIS — C44729 Squamous cell carcinoma of skin of left lower limb, including hip: Secondary | ICD-10-CM | POA: Diagnosis not present

## 2019-11-15 DIAGNOSIS — Z85828 Personal history of other malignant neoplasm of skin: Secondary | ICD-10-CM | POA: Diagnosis not present

## 2019-11-16 DIAGNOSIS — I10 Essential (primary) hypertension: Secondary | ICD-10-CM | POA: Diagnosis not present

## 2019-11-16 DIAGNOSIS — Z8589 Personal history of malignant neoplasm of other organs and systems: Secondary | ICD-10-CM | POA: Diagnosis not present

## 2019-11-16 DIAGNOSIS — Z0000 Encounter for general adult medical examination without abnormal findings: Secondary | ICD-10-CM | POA: Diagnosis not present

## 2019-11-16 DIAGNOSIS — R7301 Impaired fasting glucose: Secondary | ICD-10-CM | POA: Diagnosis not present

## 2019-11-16 DIAGNOSIS — Z803 Family history of malignant neoplasm of breast: Secondary | ICD-10-CM | POA: Diagnosis not present

## 2019-11-16 DIAGNOSIS — M85852 Other specified disorders of bone density and structure, left thigh: Secondary | ICD-10-CM | POA: Diagnosis not present

## 2019-11-16 DIAGNOSIS — Z8542 Personal history of malignant neoplasm of other parts of uterus: Secondary | ICD-10-CM | POA: Diagnosis not present

## 2020-01-08 ENCOUNTER — Encounter: Payer: Self-pay | Admitting: Obstetrics and Gynecology

## 2020-01-08 DIAGNOSIS — M85852 Other specified disorders of bone density and structure, left thigh: Secondary | ICD-10-CM | POA: Diagnosis not present

## 2020-01-08 DIAGNOSIS — M85851 Other specified disorders of bone density and structure, right thigh: Secondary | ICD-10-CM | POA: Diagnosis not present

## 2020-01-08 DIAGNOSIS — Z1231 Encounter for screening mammogram for malignant neoplasm of breast: Secondary | ICD-10-CM | POA: Diagnosis not present

## 2020-01-11 ENCOUNTER — Encounter: Payer: Self-pay | Admitting: Gynecology

## 2020-01-11 NOTE — Progress Notes (Signed)
Please let patient know that she does have osteopenia identified at several sites on her recent bone density survey.  She should make sure she is getting adequate calcium and vitamin D and regular weightbearing exercise.  Can also further discuss at her upcoming visit.

## 2020-02-09 ENCOUNTER — Encounter: Payer: Self-pay | Admitting: Obstetrics and Gynecology

## 2020-02-09 ENCOUNTER — Ambulatory Visit (INDEPENDENT_AMBULATORY_CARE_PROVIDER_SITE_OTHER): Payer: Medicare Other | Admitting: Obstetrics and Gynecology

## 2020-02-09 ENCOUNTER — Encounter: Payer: Medicare Other | Admitting: Gynecology

## 2020-02-09 ENCOUNTER — Other Ambulatory Visit: Payer: Self-pay

## 2020-02-09 ENCOUNTER — Encounter: Payer: Medicare Other | Admitting: Obstetrics and Gynecology

## 2020-02-09 VITALS — BP 138/82 | Ht 65.0 in | Wt 125.0 lb

## 2020-02-09 DIAGNOSIS — Z9189 Other specified personal risk factors, not elsewhere classified: Secondary | ICD-10-CM | POA: Diagnosis not present

## 2020-02-09 DIAGNOSIS — M85862 Other specified disorders of bone density and structure, left lower leg: Secondary | ICD-10-CM

## 2020-02-09 DIAGNOSIS — Z8542 Personal history of malignant neoplasm of other parts of uterus: Secondary | ICD-10-CM

## 2020-02-09 DIAGNOSIS — Z9289 Personal history of other medical treatment: Secondary | ICD-10-CM

## 2020-02-09 DIAGNOSIS — M8580 Other specified disorders of bone density and structure, unspecified site: Secondary | ICD-10-CM | POA: Diagnosis not present

## 2020-02-09 DIAGNOSIS — Z01419 Encounter for gynecological examination (general) (routine) without abnormal findings: Secondary | ICD-10-CM

## 2020-02-09 DIAGNOSIS — M8589 Other specified disorders of bone density and structure, multiple sites: Secondary | ICD-10-CM

## 2020-02-09 DIAGNOSIS — D171 Benign lipomatous neoplasm of skin and subcutaneous tissue of trunk: Secondary | ICD-10-CM

## 2020-02-09 LAB — VITAMIN D 25 HYDROXY (VIT D DEFICIENCY, FRACTURES): Vit D, 25-Hydroxy: 33 ng/mL (ref 30–100)

## 2020-02-09 NOTE — Progress Notes (Signed)
ZAMARIA BRAZZLE June 08, 1941 [id number]  SUBJECTIVE:  79 y.o. [id number] female here for an annual routine gynecologic exam. She has no gynecologic concerns.  Current Outpatient Medications  Medication Sig Dispense Refill  . calcium-vitamin D (OSCAL WITH D) 500-200 MG-UNIT per tablet Take 1 tablet by mouth. Take 2 daily     . hydrochlorothiazide (,MICROZIDE/HYDRODIURIL,) 12.5 MG capsule Take 12.5 mg by mouth daily.      . Multiple Vitamins-Minerals (CENTRUM SILVER PO) Take by mouth daily.      . pravastatin (PRAVACHOL) 20 MG tablet Take 20 mg by mouth daily.      Marland Kitchen alendronate (FOSAMAX) 70 MG tablet Take 70 mg by mouth once a week. Take with a full glass of water on an empty stomach. (Patient not taking: Reported on 02/09/2020)     No current facility-administered medications for this visit.   Allergies: Lidocaine  No LMP recorded. Patient has had a hysterectomy.  Past medical history,surgical history, problem list, medications, allergies, family history and social history were all reviewed and documented as reviewed in the EPIC chart.  ROS:  Feeling well. No dyspnea or chest pain on exertion.  No abdominal pain, change in bowel habits, black or bloody stools.  No urinary tract symptoms. GYN ROS: no abnormal bleeding, pelvic pain or discharge, no breast pain or new or enlarging lumps on self exam. No neurological complaints.   OBJECTIVE:  BP 138/82   Ht 5\' 5"  (1.651 m)   Wt 125 lb (56.7 kg)   BMI 20.80 kg/m  The patient appears well, alert, oriented x 3, in no distress. ENT normal.  Neck supple. No cervical or supraclavicular adenopathy or thyromegaly.  Lungs are clear, good air entry, no wheezes, rhonchi or rales. S1 and S2 normal, no murmurs, regular rate and rhythm.  Abdomen soft without tenderness, guarding, mass or organomegaly.  Neurological is normal, no focal findings.  BREAST EXAM: breasts appear normal, no suspicious masses, no skin or nipple changes or axillary nodes,  Left  breast with 2 cm lipomatous mass 6 o'clock position off of areola  PELVIC EXAM: VULVA: normal appearing vulva with no masses, tenderness or lesions, VAGINA: normal appearing vagina with normal color and discharge, no lesions, CERVIX: surgically absent, UTERUS: surgically absent, vaginal cuff normal, ADNEXA: no masses, no tenderness, no inguinal lymphadenopathy  Chaperone: [name] present during the examination  ASSESSMENT:  79 y.o. [id number] here for annual gynecologic exam  PLAN:   1. Postmenopausal.  No significant hot flashes or night sweats.  No vaginal bleeding. 2. Stage IB grade 2 endometrial carcinoma in 2011.  Status post TAH/BSO with lymph node dissection with follow-up radiation.  Exam NED today. 3. Pap smear 2018.  No history of abnormal Pap smears.  We discussed current recommendations and guidelines.  Pap smear screening surveillance is not necessary moving forward in absence of any abnormal Pap smear history, discussed different mechanism from endometrial cancer.  She is comfortable with not continuing screening. 4. Mammogram 01/2020.  Normal breast exam today.  Continue with annual mammograms. 5. Colonoscopy 2008.  She goes by her primary care doctor's recommendations in regards to colon cancer screening. 6. Osteopenia. DEXA 01/2020.  T score -2.2 with some density loss in the left hip.  Ensure adequate vitamin D and calcium and regular weightbearing exercise.  Next DEXA recommended 2023 so she plans to schedule this when due.  Was previously on Fosamax through her primary doctor and stopped this last year, also stopped vitamin D per his  recommendations.  She reports a normal vitamin D level.  We will recheck the vitamin D level today. 7. Health maintenance.  No other labs today as she normally has these completed elsewhere.  Return annually or sooner, prn.  Joseph Pierini MD 02/09/20

## 2020-02-12 NOTE — Progress Notes (Signed)
Please tell patient Vitamin D level is low normal, may want to consider a supplement of 1000 IU vitamin D per day.

## 2020-02-21 DIAGNOSIS — L821 Other seborrheic keratosis: Secondary | ICD-10-CM | POA: Diagnosis not present

## 2020-02-21 DIAGNOSIS — Z85828 Personal history of other malignant neoplasm of skin: Secondary | ICD-10-CM | POA: Diagnosis not present

## 2020-04-24 DIAGNOSIS — Z23 Encounter for immunization: Secondary | ICD-10-CM | POA: Diagnosis not present

## 2020-05-10 DIAGNOSIS — L814 Other melanin hyperpigmentation: Secondary | ICD-10-CM | POA: Diagnosis not present

## 2020-05-10 DIAGNOSIS — D2272 Melanocytic nevi of left lower limb, including hip: Secondary | ICD-10-CM | POA: Diagnosis not present

## 2020-05-10 DIAGNOSIS — Z85828 Personal history of other malignant neoplasm of skin: Secondary | ICD-10-CM | POA: Diagnosis not present

## 2020-05-10 DIAGNOSIS — D225 Melanocytic nevi of trunk: Secondary | ICD-10-CM | POA: Diagnosis not present

## 2020-05-10 DIAGNOSIS — D1722 Benign lipomatous neoplasm of skin and subcutaneous tissue of left arm: Secondary | ICD-10-CM | POA: Diagnosis not present

## 2020-05-10 DIAGNOSIS — I788 Other diseases of capillaries: Secondary | ICD-10-CM | POA: Diagnosis not present

## 2020-05-10 DIAGNOSIS — L821 Other seborrheic keratosis: Secondary | ICD-10-CM | POA: Diagnosis not present

## 2020-05-10 DIAGNOSIS — D1801 Hemangioma of skin and subcutaneous tissue: Secondary | ICD-10-CM | POA: Diagnosis not present

## 2020-05-10 DIAGNOSIS — L570 Actinic keratosis: Secondary | ICD-10-CM | POA: Diagnosis not present

## 2020-05-10 DIAGNOSIS — D1721 Benign lipomatous neoplasm of skin and subcutaneous tissue of right arm: Secondary | ICD-10-CM | POA: Diagnosis not present

## 2020-05-13 DIAGNOSIS — Z23 Encounter for immunization: Secondary | ICD-10-CM | POA: Diagnosis not present

## 2020-06-06 DIAGNOSIS — R7301 Impaired fasting glucose: Secondary | ICD-10-CM | POA: Diagnosis not present

## 2020-06-06 DIAGNOSIS — M85852 Other specified disorders of bone density and structure, left thigh: Secondary | ICD-10-CM | POA: Diagnosis not present

## 2020-06-06 DIAGNOSIS — Z803 Family history of malignant neoplasm of breast: Secondary | ICD-10-CM | POA: Diagnosis not present

## 2020-06-06 DIAGNOSIS — Z8542 Personal history of malignant neoplasm of other parts of uterus: Secondary | ICD-10-CM | POA: Diagnosis not present

## 2020-06-06 DIAGNOSIS — I10 Essential (primary) hypertension: Secondary | ICD-10-CM | POA: Diagnosis not present

## 2020-06-06 DIAGNOSIS — Z0000 Encounter for general adult medical examination without abnormal findings: Secondary | ICD-10-CM | POA: Diagnosis not present

## 2020-06-06 DIAGNOSIS — Z8589 Personal history of malignant neoplasm of other organs and systems: Secondary | ICD-10-CM | POA: Diagnosis not present

## 2020-08-09 ENCOUNTER — Encounter: Payer: Self-pay | Admitting: Nurse Practitioner

## 2020-08-09 ENCOUNTER — Ambulatory Visit (INDEPENDENT_AMBULATORY_CARE_PROVIDER_SITE_OTHER): Payer: Medicare Other | Admitting: Nurse Practitioner

## 2020-08-09 ENCOUNTER — Other Ambulatory Visit: Payer: Self-pay

## 2020-08-09 VITALS — BP 124/80 | HR 68 | Resp 16 | Wt 123.0 lb

## 2020-08-09 DIAGNOSIS — R319 Hematuria, unspecified: Secondary | ICD-10-CM | POA: Diagnosis not present

## 2020-08-09 DIAGNOSIS — N3090 Cystitis, unspecified without hematuria: Secondary | ICD-10-CM

## 2020-08-09 LAB — URINALYSIS, COMPLETE W/RFL CULTURE
Bilirubin Urine: NEGATIVE
Hyaline Cast: NONE SEEN /LPF
Ketones, ur: NEGATIVE
Nitrites, Initial: NEGATIVE
Specific Gravity, Urine: 1.003 (ref 1.001–1.03)

## 2020-08-09 MED ORDER — SULFAMETHOXAZOLE-TRIMETHOPRIM 800-160 MG PO TABS: 1.0000 | ORAL_TABLET | Freq: Two times a day (BID) | ORAL | 0 refills | Status: DC

## 2020-08-09 NOTE — Patient Instructions (Signed)
Urinary Tract Infection, Adult A urinary tract infection (UTI) is an infection of any part of the urinary tract. The urinary tract includes:  The kidneys.  The ureters.  The bladder.  The urethra. These organs make, store, and get rid of pee (urine) in the body. What are the causes? This is caused by germs (bacteria) in your genital area. These germs grow and cause swelling (inflammation) of your urinary tract. What increases the risk? You are more likely to develop this condition if:  You have a small, thin tube (catheter) to drain pee.  You cannot control when you pee or poop (incontinence).  You are female, and: ? You use these methods to prevent pregnancy:  A medicine that kills sperm (spermicide).  A device that blocks sperm (diaphragm). ? You have low levels of a female hormone (estrogen). ? You are pregnant.  You have genes that add to your risk.  You are sexually active.  You take antibiotic medicines.  You have trouble peeing because of: ? A prostate that is bigger than normal, if you are female. ? A blockage in the part of your body that drains pee from the bladder (urethra). ? A kidney stone. ? A nerve condition that affects your bladder (neurogenic bladder). ? Not getting enough to drink. ? Not peeing often enough.  You have other conditions, such as: ? Diabetes. ? A weak disease-fighting system (immune system). ? Sickle cell disease. ? Gout. ? Injury of the spine. What are the signs or symptoms? Symptoms of this condition include:  Needing to pee right away (urgently).  Peeing often.  Peeing small amounts often.  Pain or burning when peeing.  Blood in the pee.  Pee that smells bad or not like normal.  Trouble peeing.  Pee that is cloudy.  Fluid coming from the vagina, if you are female.  Pain in the belly or lower back. Other symptoms include:  Throwing up (vomiting).  No urge to eat.  Feeling mixed up (confused).  Being tired  and grouchy (irritable).  A fever.  Watery poop (diarrhea). How is this treated? This condition may be treated with:  Antibiotic medicine.  Other medicines.  Drinking enough water. Follow these instructions at home:  Medicines  Take over-the-counter and prescription medicines only as told by your doctor.  If you were prescribed an antibiotic medicine, take it as told by your doctor. Do not stop taking it even if you start to feel better. General instructions  Make sure you: ? Pee until your bladder is empty. ? Do not hold pee for a long time. ? Empty your bladder after sex. ? Wipe from front to back after pooping if you are a female. Use each tissue one time when you wipe.  Drink enough fluid to keep your pee pale yellow.  Keep all follow-up visits as told by your doctor. This is important. Contact a doctor if:  You do not get better after 1-2 days.  Your symptoms go away and then come back. Get help right away if:  You have very bad back pain.  You have very bad pain in your lower belly.  You have a fever.  You are sick to your stomach (nauseous).  You are throwing up. Summary  A urinary tract infection (UTI) is an infection of any part of the urinary tract.  This condition is caused by germs in your genital area.  There are many risk factors for a UTI. These include having a small, thin   tube to drain pee and not being able to control when you pee or poop.  Treatment includes antibiotic medicines for germs.  Drink enough fluid to keep your pee pale yellow. This information is not intended to replace advice given to you by your health care provider. Make sure you discuss any questions you have with your health care provider. Document Revised: 07/07/2018 Document Reviewed: 01/27/2018 Elsevier Patient Education  2020 [employer]  

## 2020-08-09 NOTE — Progress Notes (Signed)
GYNECOLOGY  VISIT  CC:   Blood in urine  HPI: 80 y.o. [id number] Widowed White or Caucasian female here for uti.   Reports stinging with urination, blood in urine started yesterday. Yesterday had urinary frequency. It improved on its own through out the day but wants to be checked for bladder infection before weekend. No vaginal problems, no vaginal bleeding, not sexually active Hx Hyst r/t endometrial cancer   GYNECOLOGIC HISTORY: No LMP recorded. Patient has had a hysterectomy. Contraception: hysterectomy Menopausal hormone therapy: none  Patient Active Problem List   Diagnosis Date Noted  . Renal mass 07/02/2013  . Rectocele 01/23/2013  . Hx of radiation therapy   . Basal cell cancer   . Malignant neoplasm of body of uterus 07/02/2011    Class: Stage 1    Past Medical History:  Diagnosis Date  . Basal cell cancer   . Endometrial cancer ([place]) 2011  . Hx of radiation therapy 01/2010   HDR x 5 fractions  . Hypercholesteremia   . Malignant neoplasm of body of uterus 07/02/2011   Stage IB, grade 2  . Osteopenia 10/2017   T score -1.9 stable from prior DEXA    Past Surgical History:  Procedure Laterality Date  . ABDOMINAL HYSTERECTOMY     TAH BSO  . BASAL CELL CARCINOMA EXCISION    . Bilateral blepharoplasty  May 2011  . CATARACT EXTRACTION     bi-lat  . DILATION AND CURETTAGE OF UTERUS    . TOTAL ABDOMINAL HYSTERECTOMY W/ BILATERAL SALPINGOOPHORECTOMY  12/03/2009    MEDS:   Current Outpatient Medications on File Prior to Visit  Medication Sig Dispense Refill  . calcium-vitamin D (OSCAL WITH D) 500-200 MG-UNIT per tablet Take 1 tablet by mouth. Take 2 daily    . hydrochlorothiazide (,MICROZIDE/HYDRODIURIL,) 12.5 MG capsule Take 12.5 mg by mouth daily.    . Multiple Vitamins-Minerals (CENTRUM SILVER PO) Take by mouth daily.    . pravastatin (PRAVACHOL) 20 MG tablet Take 20 mg by mouth daily.    Marland Kitchen VITAMIN D PO Take by mouth.     No current facility-administered  medications on file prior to visit.    ALLERGIES: Lidocaine  Family History  Problem Relation Age of Onset  . Breast cancer Sister 48  . Heart disease Sister   . Breast cancer Daughter 80  . Emphysema Mother   . Hypertension Father   . Emphysema Father   . Breast cancer Daughter 70  . Cancer Daughter        Melanoma,Leukemia     Review of Systems  Constitutional: Negative.   HENT: Negative.   Eyes: Negative.   Respiratory: Negative.   Cardiovascular: Negative.   Gastrointestinal: Negative.   Endocrine: Negative.   Genitourinary: Positive for frequency and hematuria.       Discomfort  Musculoskeletal: Negative.   Skin: Negative.   Allergic/Immunologic: Negative.   Neurological: Negative.   Hematological: Negative.   Psychiatric/Behavioral: Negative.     PHYSICAL EXAMINATION:    Wt 123 lb (55.8 kg)   BMI 20.47 kg/m     General appearance: alert, cooperative, no acute distress NEG CVAT  Pelvic: External genitalia:  no lesions              Urethra:  normal appearing urethra with no masses, tenderness or lesions, no redness              Bartholins and Skenes: normal      Assessment/Plan: Hematuria, unspecified type - Plan:  Urinalysis,Complete w/RFL Culture  Cystitis - Plan: sulfamethoxazole-trimethoprim (BACTRIM DS) 800-160 MG tablet     20 minutes of total time was spent for this patient encounter, including preparation, face-to-face counseling with the patient and coordination of care, and documentation of the encounter.

## 2020-08-12 LAB — URINALYSIS, COMPLETE W/RFL CULTURE
Glucose, UA: NEGATIVE
Protein, ur: NEGATIVE
pH: 6 (ref 5.0–8.0)

## 2020-08-12 LAB — URINE CULTURE
MICRO NUMBER:: 11394347
SPECIMEN QUALITY:: ADEQUATE

## 2020-08-12 LAB — CULTURE INDICATED

## 2020-09-11 DIAGNOSIS — L814 Other melanin hyperpigmentation: Secondary | ICD-10-CM | POA: Diagnosis not present

## 2020-09-11 DIAGNOSIS — D1722 Benign lipomatous neoplasm of skin and subcutaneous tissue of left arm: Secondary | ICD-10-CM | POA: Diagnosis not present

## 2020-09-11 DIAGNOSIS — L570 Actinic keratosis: Secondary | ICD-10-CM | POA: Diagnosis not present

## 2020-09-11 DIAGNOSIS — Z85828 Personal history of other malignant neoplasm of skin: Secondary | ICD-10-CM | POA: Diagnosis not present

## 2020-09-11 DIAGNOSIS — D1724 Benign lipomatous neoplasm of skin and subcutaneous tissue of left leg: Secondary | ICD-10-CM | POA: Diagnosis not present

## 2020-09-11 DIAGNOSIS — L821 Other seborrheic keratosis: Secondary | ICD-10-CM | POA: Diagnosis not present

## 2020-09-11 DIAGNOSIS — D485 Neoplasm of uncertain behavior of skin: Secondary | ICD-10-CM | POA: Diagnosis not present

## 2020-09-11 DIAGNOSIS — C44612 Basal cell carcinoma of skin of right upper limb, including shoulder: Secondary | ICD-10-CM | POA: Diagnosis not present

## 2020-09-11 DIAGNOSIS — L910 Hypertrophic scar: Secondary | ICD-10-CM | POA: Diagnosis not present

## 2020-09-11 DIAGNOSIS — D1801 Hemangioma of skin and subcutaneous tissue: Secondary | ICD-10-CM | POA: Diagnosis not present

## 2020-11-26 DIAGNOSIS — Z23 Encounter for immunization: Secondary | ICD-10-CM | POA: Diagnosis not present

## 2020-12-20 DIAGNOSIS — I10 Essential (primary) hypertension: Secondary | ICD-10-CM | POA: Diagnosis not present

## 2020-12-20 DIAGNOSIS — J3489 Other specified disorders of nose and nasal sinuses: Secondary | ICD-10-CM | POA: Diagnosis not present

## 2020-12-20 DIAGNOSIS — H6501 Acute serous otitis media, right ear: Secondary | ICD-10-CM | POA: Diagnosis not present

## 2020-12-20 DIAGNOSIS — H938X1 Other specified disorders of right ear: Secondary | ICD-10-CM | POA: Diagnosis not present

## 2020-12-20 DIAGNOSIS — H6981 Other specified disorders of Eustachian tube, right ear: Secondary | ICD-10-CM | POA: Diagnosis not present

## 2020-12-24 DIAGNOSIS — H6981 Other specified disorders of Eustachian tube, right ear: Secondary | ICD-10-CM | POA: Diagnosis not present

## 2020-12-24 DIAGNOSIS — F419 Anxiety disorder, unspecified: Secondary | ICD-10-CM | POA: Diagnosis not present

## 2020-12-24 DIAGNOSIS — I10 Essential (primary) hypertension: Secondary | ICD-10-CM | POA: Diagnosis not present

## 2021-01-07 DIAGNOSIS — Z0000 Encounter for general adult medical examination without abnormal findings: Secondary | ICD-10-CM | POA: Diagnosis not present

## 2021-01-07 DIAGNOSIS — Z1389 Encounter for screening for other disorder: Secondary | ICD-10-CM | POA: Diagnosis not present

## 2021-01-08 DIAGNOSIS — E7800 Pure hypercholesterolemia, unspecified: Secondary | ICD-10-CM | POA: Diagnosis not present

## 2021-01-08 DIAGNOSIS — I10 Essential (primary) hypertension: Secondary | ICD-10-CM | POA: Diagnosis not present

## 2021-01-08 DIAGNOSIS — R0989 Other specified symptoms and signs involving the circulatory and respiratory systems: Secondary | ICD-10-CM | POA: Diagnosis not present

## 2021-01-10 ENCOUNTER — Other Ambulatory Visit (HOSPITAL_COMMUNITY): Payer: Self-pay | Admitting: Family Medicine

## 2021-01-10 DIAGNOSIS — R0989 Other specified symptoms and signs involving the circulatory and respiratory systems: Secondary | ICD-10-CM

## 2021-01-13 DIAGNOSIS — Z803 Family history of malignant neoplasm of breast: Secondary | ICD-10-CM | POA: Diagnosis not present

## 2021-01-13 DIAGNOSIS — Z1231 Encounter for screening mammogram for malignant neoplasm of breast: Secondary | ICD-10-CM | POA: Diagnosis not present

## 2021-01-17 ENCOUNTER — Other Ambulatory Visit: Payer: Self-pay

## 2021-01-17 ENCOUNTER — Ambulatory Visit (HOSPITAL_COMMUNITY)
Admission: RE | Admit: 2021-01-17 | Discharge: 2021-01-17 | Disposition: A | Discharge status: Home / Self Care | Payer: Medicare Other | Source: Ambulatory Visit | Attending: Family Medicine | Admitting: Family Medicine

## 2021-01-17 DIAGNOSIS — R0989 Other specified symptoms and signs involving the circulatory and respiratory systems: Secondary | ICD-10-CM | POA: Insufficient documentation

## 2021-01-17 NOTE — Progress Notes (Signed)
Carotid artery duplex has been completed. Preliminary results can be found in CV Proc through chart review.   01/17/21 9:01 AM Victoria Wyatt RVT

## 2021-01-28 DIAGNOSIS — H353131 Nonexudative age-related macular degeneration, bilateral, early dry stage: Secondary | ICD-10-CM | POA: Diagnosis not present

## 2021-01-28 DIAGNOSIS — H53031 Strabismic amblyopia, right eye: Secondary | ICD-10-CM | POA: Diagnosis not present

## 2021-01-28 DIAGNOSIS — H04123 Dry eye syndrome of bilateral lacrimal glands: Secondary | ICD-10-CM | POA: Diagnosis not present

## 2021-01-28 DIAGNOSIS — Z961 Presence of intraocular lens: Secondary | ICD-10-CM | POA: Diagnosis not present

## 2021-02-11 ENCOUNTER — Other Ambulatory Visit: Payer: Self-pay

## 2021-02-11 ENCOUNTER — Encounter: Payer: Medicare Other | Admitting: Obstetrics and Gynecology

## 2021-02-11 ENCOUNTER — Encounter: Payer: Self-pay | Admitting: Nurse Practitioner

## 2021-02-11 ENCOUNTER — Ambulatory Visit (INDEPENDENT_AMBULATORY_CARE_PROVIDER_SITE_OTHER): Payer: Medicare Other | Admitting: Nurse Practitioner

## 2021-02-11 VITALS — BP 146/74 | HR 60 | Ht 64.0 in | Wt 126.0 lb

## 2021-02-11 DIAGNOSIS — Z01419 Encounter for gynecological examination (general) (routine) without abnormal findings: Secondary | ICD-10-CM | POA: Diagnosis not present

## 2021-02-11 DIAGNOSIS — Z8542 Personal history of malignant neoplasm of other parts of uterus: Secondary | ICD-10-CM | POA: Insufficient documentation

## 2021-02-11 DIAGNOSIS — Z90710 Acquired absence of both cervix and uterus: Secondary | ICD-10-CM | POA: Insufficient documentation

## 2021-02-11 DIAGNOSIS — Z90722 Acquired absence of ovaries, bilateral: Secondary | ICD-10-CM

## 2021-02-11 DIAGNOSIS — Z9079 Acquired absence of other genital organ(s): Secondary | ICD-10-CM

## 2021-02-11 DIAGNOSIS — M8589 Other specified disorders of bone density and structure, multiple sites: Secondary | ICD-10-CM | POA: Insufficient documentation

## 2021-02-11 NOTE — Progress Notes (Addendum)
   EIMI VINEY 1941/04/15 [id number]   History:  80 y.o. [id number] presents for breast and pelvic exam without GYN complaints. Postmenopausal - no HRT. S/P 2011 TAH BSO for stage 1B grade 2 endometrial carcinoma. Normal pap and mammogram history. Osteopenia, was on Fosamax in the past, managed by PCP.   Gynecologic History No LMP recorded. Patient has had a hysterectomy.   Contraception: status post hysterectomy  Health Maintenance Last Pap: 01/29/2017. Results were: Normal Last mammogram: 01/2021. Results were: Normal per patient (we do not have report) Last colonoscopy: 2008. Results were: Normal Last Dexa: 01/08/2020. Results were: T-score -2.2  Past medical history, past surgical history, family history and social history were all reviewed and documented in the EPIC chart. Widowed. 2 daughters, 3 grandchildren.   ROS:  A ROS was performed and pertinent positives and negatives are included.  Exam:  Vitals:   02/11/21 1023  BP: (!) 146/74  Pulse: 60  SpO2: 99%  Weight: 126 lb (57.2 kg)  Height: 5\' 4"  (1.626 m)   Body mass index is 21.63 kg/m.  General appearance:  Normal Thyroid:  Symmetrical, normal in size, without palpable masses or nodularity. Respiratory  Auscultation:  Clear without wheezing or rhonchi Cardiovascular  Auscultation:  Regular rate, without rubs, murmurs or gallops  Edema/varicosities:  Not grossly evident Abdominal  Soft,nontender, without masses, guarding or rebound.  Liver/spleen:  No organomegaly noted  Hernia:  None appreciated  Skin  Inspection:  Grossly normal Breasts: Examined lying and sitting.   Right: Without masses, retractions, nipple discharge or axillary adenopathy.   Left: Without masses, retractions, nipple discharge or axillary adenopathy. Genitourinary   Inguinal/mons:  Normal without inguinal adenopathy  External genitalia:  Normal appearing vulva with no masses, tenderness, or lesions  BUS/Urethra/Skene's glands:   Normal  Vagina:  Normal appearing with normal color and discharge, no lesions. Atrophic changes.   Cervix:  Absent  Uterus:  Absent  Anus and perineum: Normal  Digital rectal exam: Normal sphincter tone without palpated masses or tenderness  Patient informed chaperone available to be present for breast and pelvic exam. Patient has requested no chaperone to be present. Patient has been advised what will be completed during breast and pelvic exam.   Assessment/Plan:  80 y.o. [id number] for breast and pelvic exam.   Well female exam with routine gynecological exam - Education provided on SBEs, importance of preventative screenings, current guidelines, high calcium diet, regular exercise, and multivitamin daily. Labs with PCP.   Osteopenia of multiple sites - 01/2020 T-score -2.2. Managed by PCP. Was on Fosamax in the past. Will repeat DXA at 2-year interval per recommendation. Continue high calcium diet, vitamin D supplement, and weightbearing exercises. Fall safety discussed.   History of total abdominal hysterectomy and bilateral salpingo-oophorectomy - 2011 for endometrial cancer.   History of endometrial cancer - Stage 1B grade 2. S/P TAH BSO.  Screening for cervical cancer - Normal Pap history.  Discussed current guidelines and option to stop screening and she is agreeable.   Screening for breast cancer - Normal mammogram history.  Continue annual screenings.  Normal breast exam today.  Screening for colon cancer - 2008 colonoscopy. She is unsure if she is to continue screenings per GI. Recommend following up with them for recommendations.   Return in 2 years for breast and pelvic exam.    Tamela Gammon DNP, 10:47 AM 02/11/2021

## 2021-03-31 DIAGNOSIS — I10 Essential (primary) hypertension: Secondary | ICD-10-CM | POA: Diagnosis not present

## 2021-03-31 DIAGNOSIS — R7302 Impaired glucose tolerance (oral): Secondary | ICD-10-CM | POA: Diagnosis not present

## 2021-03-31 DIAGNOSIS — Z0000 Encounter for general adult medical examination without abnormal findings: Secondary | ICD-10-CM | POA: Diagnosis not present

## 2021-03-31 DIAGNOSIS — I6523 Occlusion and stenosis of bilateral carotid arteries: Secondary | ICD-10-CM | POA: Diagnosis not present

## 2021-03-31 DIAGNOSIS — Z9889 Other specified postprocedural states: Secondary | ICD-10-CM | POA: Diagnosis not present

## 2021-03-31 DIAGNOSIS — D179 Benign lipomatous neoplasm, unspecified: Secondary | ICD-10-CM | POA: Diagnosis not present

## 2021-03-31 DIAGNOSIS — E785 Hyperlipidemia, unspecified: Secondary | ICD-10-CM | POA: Diagnosis not present

## 2021-03-31 DIAGNOSIS — Z90710 Acquired absence of both cervix and uterus: Secondary | ICD-10-CM | POA: Diagnosis not present

## 2021-03-31 DIAGNOSIS — E559 Vitamin D deficiency, unspecified: Secondary | ICD-10-CM | POA: Diagnosis not present

## 2021-03-31 DIAGNOSIS — Z87891 Personal history of nicotine dependence: Secondary | ICD-10-CM | POA: Diagnosis not present

## 2021-03-31 DIAGNOSIS — Z7189 Other specified counseling: Secondary | ICD-10-CM | POA: Diagnosis not present

## 2021-03-31 DIAGNOSIS — M8580 Other specified disorders of bone density and structure, unspecified site: Secondary | ICD-10-CM | POA: Diagnosis not present

## 2021-04-01 DIAGNOSIS — D1801 Hemangioma of skin and subcutaneous tissue: Secondary | ICD-10-CM | POA: Diagnosis not present

## 2021-04-01 DIAGNOSIS — C4441 Basal cell carcinoma of skin of scalp and neck: Secondary | ICD-10-CM | POA: Diagnosis not present

## 2021-04-01 DIAGNOSIS — C44319 Basal cell carcinoma of skin of other parts of face: Secondary | ICD-10-CM | POA: Diagnosis not present

## 2021-04-01 DIAGNOSIS — Z85828 Personal history of other malignant neoplasm of skin: Secondary | ICD-10-CM | POA: Diagnosis not present

## 2021-04-01 DIAGNOSIS — D1721 Benign lipomatous neoplasm of skin and subcutaneous tissue of right arm: Secondary | ICD-10-CM | POA: Diagnosis not present

## 2021-04-01 DIAGNOSIS — L821 Other seborrheic keratosis: Secondary | ICD-10-CM | POA: Diagnosis not present

## 2021-04-01 DIAGNOSIS — C44311 Basal cell carcinoma of skin of nose: Secondary | ICD-10-CM | POA: Diagnosis not present

## 2021-04-01 DIAGNOSIS — L820 Inflamed seborrheic keratosis: Secondary | ICD-10-CM | POA: Diagnosis not present

## 2021-04-01 DIAGNOSIS — L570 Actinic keratosis: Secondary | ICD-10-CM | POA: Diagnosis not present

## 2021-04-30 DIAGNOSIS — C44311 Basal cell carcinoma of skin of nose: Secondary | ICD-10-CM | POA: Diagnosis not present

## 2021-04-30 DIAGNOSIS — Z85828 Personal history of other malignant neoplasm of skin: Secondary | ICD-10-CM | POA: Diagnosis not present

## 2021-04-30 DIAGNOSIS — C44319 Basal cell carcinoma of skin of other parts of face: Secondary | ICD-10-CM | POA: Diagnosis not present

## 2021-06-05 DIAGNOSIS — Z23 Encounter for immunization: Secondary | ICD-10-CM | POA: Diagnosis not present

## 2023-03-17 ENCOUNTER — Ambulatory Visit (INDEPENDENT_AMBULATORY_CARE_PROVIDER_SITE_OTHER): Payer: Medicare Other | Admitting: Nurse Practitioner

## 2023-03-17 ENCOUNTER — Encounter: Payer: Self-pay | Admitting: Nurse Practitioner

## 2023-03-17 VITALS — BP 116/82 | HR 62 | Ht 63.25 in | Wt 125.0 lb

## 2023-03-17 DIAGNOSIS — Z01419 Encounter for gynecological examination (general) (routine) without abnormal findings: Secondary | ICD-10-CM

## 2023-03-17 DIAGNOSIS — Z9189 Other specified personal risk factors, not elsewhere classified: Secondary | ICD-10-CM | POA: Diagnosis not present

## 2023-03-17 DIAGNOSIS — M8589 Other specified disorders of bone density and structure, multiple sites: Secondary | ICD-10-CM

## 2023-03-17 DIAGNOSIS — Z780 Asymptomatic menopausal state: Secondary | ICD-10-CM

## 2023-03-17 NOTE — Progress Notes (Signed)
   Victoria Wyatt 1941/01/22 [id number]   History:  82 y.o. [id number] presents for breast and pelvic exam without GYN complaints. Postmenopausal - no HRT. S/P 2011 TAH BSO for stage 1B grade 2 endometrial carcinoma. Normal pap and mammogram history. Osteopenia, was on Fosamax in the past, managed by PCP.   Gynecologic History No LMP recorded. Patient has had a hysterectomy.   Contraception: status post hysterectomy Sexually active: No  Health Maintenance Last Pap: 01/29/2017. Results were: Normal Last mammogram: 2024 per patient. Results were: Normal Last colonoscopy: 2008. Results were: Normal Last Dexa: 01/08/2020. Results were: T-score -2.2  Past medical history, past surgical history, family history and social history were all reviewed and documented in the EPIC chart. Widowed. Lives at home. Plays bridge, very active. 2 daughters, 3 grandchildren.   ROS:  A ROS was performed and pertinent positives and negatives are included.  Exam:  Vitals:   03/17/23 1004  BP: 116/82  Pulse: 62  SpO2: 98%  Weight: 125 lb (56.7 kg)  Height: 5' 3.25" (1.607 m)    Body mass index is 21.97 kg/m.  General appearance:  Normal Thyroid:  Symmetrical, normal in size, without palpable masses or nodularity. Respiratory  Auscultation:  Clear without wheezing or rhonchi Cardiovascular  Auscultation:  Regular rate, without rubs, murmurs or gallops  Edema/varicosities:  Not grossly evident Abdominal  Soft,nontender, without masses, guarding or rebound.  Liver/spleen:  No organomegaly noted  Hernia:  None appreciated  Skin  Inspection:  Grossly normal Breasts: Examined lying and sitting.   Right: Without masses, retractions, nipple discharge or axillary adenopathy.   Left: Without masses, retractions, nipple discharge or axillary adenopathy. Genitourinary   Inguinal/mons:  Normal without inguinal adenopathy  External genitalia:  Normal appearing vulva with no masses, tenderness, or  lesions  BUS/Urethra/Skene's glands:  Normal  Vagina:  Normal appearing with normal color and discharge, no lesions. Atrophic changes.   Cervix:  Absent  Uterus:  Absent  Anus and perineum: Normal  Digital rectal exam: Not indicated  Patient informed chaperone available to be present for breast and pelvic exam. Patient has requested no chaperone to be present. Patient has been advised what will be completed during breast and pelvic exam.   Assessment/Plan:  82 y.o. [id number] for breast and pelvic exam.   Encounter for breast and pelvic examination - Education provided on SBEs, importance of preventative screenings, current guidelines, high calcium diet, regular exercise, and multivitamin daily. Labs with PCP.   Osteopenia of multiple sites - 01/2020 T-score -2.2. Thinks she may have had more recent DXA. Managed by PCP. Was on Fosamax in the past. Continue high calcium diet, vitamin D supplement, and weightbearing exercises. Walks 3 miles 4 days per week, does resistance training. Fall risk and safety precautions reviewed.  Postmenopausal - no HRT. S/P 2011 for endometrial cancer.   Screening for cervical cancer - Normal Pap history.  No longer screening.   Screening for breast cancer - Normal mammogram history.  Continue annual screenings.  Normal breast exam today.  Screening for colon cancer - 2008 colonoscopy. No longer screening.   Return in 2 years for breast and pelvic exam.      Olivia Mackie DNP, 10:41 AM 03/17/2023
